# Patient Record
Sex: Female | Born: 1990
Health system: Southern US, Community
[De-identification: ages and names within clinical notes are randomized; demographics above are authoritative.]

## PROBLEM LIST (undated history)

## (undated) DIAGNOSIS — G8929 Other chronic pain: Secondary | ICD-10-CM

## (undated) DIAGNOSIS — M549 Dorsalgia, unspecified: Secondary | ICD-10-CM

## (undated) DIAGNOSIS — M797 Fibromyalgia: Secondary | ICD-10-CM

## (undated) DIAGNOSIS — D352 Benign neoplasm of pituitary gland: Secondary | ICD-10-CM

## (undated) DIAGNOSIS — F4310 Post-traumatic stress disorder, unspecified: Secondary | ICD-10-CM

## (undated) DIAGNOSIS — S069X9A Unspecified intracranial injury with loss of consciousness of unspecified duration, initial encounter: Secondary | ICD-10-CM

## (undated) DIAGNOSIS — R569 Unspecified convulsions: Secondary | ICD-10-CM

## (undated) DIAGNOSIS — S069XAA Unspecified intracranial injury with loss of consciousness status unknown, initial encounter: Secondary | ICD-10-CM

## (undated) DIAGNOSIS — J45909 Unspecified asthma, uncomplicated: Secondary | ICD-10-CM

## (undated) DIAGNOSIS — G43909 Migraine, unspecified, not intractable, without status migrainosus: Secondary | ICD-10-CM

## (undated) HISTORY — DX: Fibromyalgia: M79.7

## (undated) HISTORY — PX: APPENDECTOMY: SHX54

## (undated) HISTORY — PX: KIDNEY STONE SURGERY: SHX686

## (undated) HISTORY — DX: Migraine, unspecified, not intractable, without status migrainosus: G43.909

---

## 2011-07-24 DIAGNOSIS — R569 Unspecified convulsions: Secondary | ICD-10-CM | POA: Insufficient documentation

## 2012-11-10 DIAGNOSIS — F4310 Post-traumatic stress disorder, unspecified: Secondary | ICD-10-CM | POA: Insufficient documentation

## 2012-11-10 DIAGNOSIS — N200 Calculus of kidney: Secondary | ICD-10-CM | POA: Insufficient documentation

## 2012-11-12 DIAGNOSIS — G43109 Migraine with aura, not intractable, without status migrainosus: Secondary | ICD-10-CM | POA: Insufficient documentation

## 2012-11-12 DIAGNOSIS — M5030 Other cervical disc degeneration, unspecified cervical region: Secondary | ICD-10-CM | POA: Insufficient documentation

## 2012-11-12 DIAGNOSIS — R000 Tachycardia, unspecified: Secondary | ICD-10-CM | POA: Insufficient documentation

## 2012-11-12 DIAGNOSIS — J4530 Mild persistent asthma, uncomplicated: Secondary | ICD-10-CM | POA: Insufficient documentation

## 2012-11-12 DIAGNOSIS — D497 Neoplasm of unspecified behavior of endocrine glands and other parts of nervous system: Secondary | ICD-10-CM | POA: Insufficient documentation

## 2012-11-12 DIAGNOSIS — G8929 Other chronic pain: Secondary | ICD-10-CM | POA: Insufficient documentation

## 2012-11-12 DIAGNOSIS — G40909 Epilepsy, unspecified, not intractable, without status epilepticus: Secondary | ICD-10-CM | POA: Insufficient documentation

## 2012-11-12 DIAGNOSIS — M5126 Other intervertebral disc displacement, lumbar region: Secondary | ICD-10-CM | POA: Insufficient documentation

## 2012-11-12 DIAGNOSIS — E669 Obesity, unspecified: Secondary | ICD-10-CM | POA: Insufficient documentation

## 2016-03-28 ENCOUNTER — Encounter (HOSPITAL_BASED_OUTPATIENT_CLINIC_OR_DEPARTMENT_OTHER): Payer: Self-pay | Admitting: Emergency Medicine

## 2016-03-28 DIAGNOSIS — Y939 Activity, unspecified: Secondary | ICD-10-CM | POA: Insufficient documentation

## 2016-03-28 DIAGNOSIS — M542 Cervicalgia: Secondary | ICD-10-CM | POA: Insufficient documentation

## 2016-03-28 DIAGNOSIS — J45909 Unspecified asthma, uncomplicated: Secondary | ICD-10-CM | POA: Insufficient documentation

## 2016-03-28 DIAGNOSIS — M545 Low back pain: Secondary | ICD-10-CM | POA: Diagnosis not present

## 2016-03-28 DIAGNOSIS — Z79899 Other long term (current) drug therapy: Secondary | ICD-10-CM | POA: Insufficient documentation

## 2016-03-28 DIAGNOSIS — S0990XA Unspecified injury of head, initial encounter: Secondary | ICD-10-CM | POA: Diagnosis not present

## 2016-03-28 DIAGNOSIS — Y929 Unspecified place or not applicable: Secondary | ICD-10-CM | POA: Insufficient documentation

## 2016-03-28 DIAGNOSIS — Y999 Unspecified external cause status: Secondary | ICD-10-CM | POA: Insufficient documentation
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
## 2016-03-29 ENCOUNTER — Emergency Department (HOSPITAL_BASED_OUTPATIENT_CLINIC_OR_DEPARTMENT_OTHER): Payer: BLUE CROSS/BLUE SHIELD

## 2016-03-29 ENCOUNTER — Encounter (HOSPITAL_BASED_OUTPATIENT_CLINIC_OR_DEPARTMENT_OTHER): Payer: Self-pay | Admitting: Emergency Medicine

## 2016-03-29 ENCOUNTER — Emergency Department (HOSPITAL_BASED_OUTPATIENT_CLINIC_OR_DEPARTMENT_OTHER)
Admission: EM | Admit: 2016-03-29 | Discharge: 2016-03-29 | Disposition: A | Discharge status: Home / Self Care | Payer: BLUE CROSS/BLUE SHIELD | Attending: Emergency Medicine | Admitting: Emergency Medicine

## 2016-03-29 HISTORY — DX: Unspecified asthma, uncomplicated: J45.909

## 2016-03-29 HISTORY — DX: Dorsalgia, unspecified: M54.9

## 2016-03-29 HISTORY — DX: Unspecified convulsions: R56.9

## 2016-03-29 HISTORY — DX: Other chronic pain: G89.29

## 2016-03-29 HISTORY — DX: Unspecified intracranial injury with loss of consciousness of unspecified duration, initial encounter: S06.9X9A

## 2016-03-29 HISTORY — DX: Unspecified intracranial injury with loss of consciousness status unknown, initial encounter: S06.9XAA

## 2016-03-29 HISTORY — DX: Benign neoplasm of pituitary gland: D35.2

## 2016-03-29 LAB — PREGNANCY, URINE: PREG TEST UR: NEGATIVE

## 2016-03-29 MED ORDER — METHOCARBAMOL 500 MG PO TABS: 500.0000 mg | ORAL_TABLET | Freq: Two times a day (BID) | ORAL | 0 refills | Status: DC

## 2016-03-29 MED ORDER — LIDOCAINE 5 % EX PTCH: 1.0000 | MEDICATED_PATCH | CUTANEOUS | 0 refills | Status: DC

## 2016-03-29 MED ORDER — OXYCODONE-ACETAMINOPHEN 5-325 MG PO TABS
1.0000 | ORAL_TABLET | Freq: Once | ORAL | Status: AC
  Administered 2016-03-29: 1 via ORAL
  Filled 2016-03-29: qty 1

## 2016-03-29 MED ORDER — METHOCARBAMOL 500 MG PO TABS
1000.0000 mg | ORAL_TABLET | Freq: Once | ORAL | Status: AC
  Administered 2016-03-29: 1000 mg via ORAL
  Filled 2016-03-29: qty 2

## 2016-03-29 MED ORDER — KETOROLAC TROMETHAMINE 60 MG/2ML IM SOLN
60.0000 mg | Freq: Once | INTRAMUSCULAR | Status: AC
  Administered 2016-03-29: 60 mg via INTRAMUSCULAR
  Filled 2016-03-29: qty 2

## 2016-03-29 NOTE — ED Provider Notes (Signed)
Observed in ED for any signs of allergic reaction following toradol.  There were no signs.  Patient is stable for discharge.     Veatrice Kells, MD 03/29/16 [phone]

## 2016-03-29 NOTE — ED Notes (Signed)
Patient transported to CT 
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
## 2016-05-28 ENCOUNTER — Other Ambulatory Visit (HOSPITAL_COMMUNITY): Payer: Self-pay | Admitting: Family Medicine

## 2016-05-28 ENCOUNTER — Other Ambulatory Visit: Payer: Self-pay | Admitting: Family Medicine

## 2016-05-28 DIAGNOSIS — M5414 Radiculopathy, thoracic region: Secondary | ICD-10-CM

## 2016-05-31 ENCOUNTER — Telehealth: Payer: Self-pay | Admitting: Behavioral Health

## 2016-05-31 NOTE — Telephone Encounter (Signed)
Unable to reach patient at time of Pre-Visit Call.  Left message for patient to return call when available.    

## 2016-06-03 ENCOUNTER — Ambulatory Visit: Payer: BLUE CROSS/BLUE SHIELD | Admitting: Family Medicine

## 2016-08-21 ENCOUNTER — Emergency Department (HOSPITAL_COMMUNITY)
Admission: EM | Admit: 2016-08-21 | Discharge: 2016-08-21 | Disposition: A | Discharge status: Home / Self Care | Payer: BLUE CROSS/BLUE SHIELD

## 2016-08-22 ENCOUNTER — Ambulatory Visit (INDEPENDENT_AMBULATORY_CARE_PROVIDER_SITE_OTHER): Payer: Self-pay | Admitting: Physician Assistant

## 2016-08-22 VITALS — BP 146/111 | HR 99 | Temp 97.6°F | Ht 62.0 in | Wt 206.6 lb

## 2016-08-22 DIAGNOSIS — Z23 Encounter for immunization: Secondary | ICD-10-CM

## 2016-08-22 DIAGNOSIS — R030 Elevated blood-pressure reading, without diagnosis of hypertension: Secondary | ICD-10-CM

## 2016-08-22 DIAGNOSIS — I10 Essential (primary) hypertension: Secondary | ICD-10-CM

## 2016-08-22 DIAGNOSIS — R42 Dizziness and giddiness: Secondary | ICD-10-CM

## 2016-08-22 MED ORDER — LISINOPRIL 5 MG PO TABS: 5.0000 mg | ORAL_TABLET | Freq: Every day | ORAL | 1 refills | Status: DC

## 2016-08-22 NOTE — Patient Instructions (Addendum)
Your EKG looks great!  Come back in 3-4 weeks for blood pressure recheck.  Please schedule an annual appointment with me in the next few months.  You cannot take this medication while you are pregnant. Please let me know ASAP if you find you are pregnant.   Thank you for coming in today. I hope you feel we met your needs.  Feel free to call [facility] if you have any questions or further requests.  Please consider signing up for MyChart if you do not already have it, as this is a great way to communicate with me.  Best,  Whitney McVey, PA-C   DASH Eating Plan DASH stands for "Dietary Approaches to Stop Hypertension." The DASH eating plan is a healthy eating plan that has been shown to reduce high blood pressure (hypertension). Additional health benefits may include reducing the risk of type 2 diabetes mellitus, heart disease, and stroke. The DASH eating plan may also help with weight loss. What do I need to know about the DASH eating plan? For the DASH eating plan, you will follow these general guidelines:  Choose foods with less than 150 milligrams of sodium per serving (as listed on the food label).  Use salt-free seasonings or herbs instead of table salt or sea salt.  Check with your health care provider or pharmacist before using salt substitutes.  Eat lower-sodium products. These are often labeled as "low-sodium" or "no salt added."  Eat fresh foods. Avoid eating a lot of canned foods.  Eat more vegetables, fruits, and low-fat dairy products.  Choose whole grains. Look for the word "whole" as the first word in the ingredient list.  Choose fish and skinless chicken or [place] more often than red meat. Limit fish, poultry, and meat to 6 oz (170 g) each day.  Limit sweets, desserts, sugars, and sugary drinks.  Choose heart-healthy fats.  Eat more home-cooked food and less restaurant, buffet, and fast food.  Limit fried foods.  Do not fry foods. Cook foods using methods such as  baking, boiling, grilling, and broiling instead.  When eating at a restaurant, ask that your food be prepared with less salt, or no salt if possible. What foods can I eat? Seek help from a dietitian for individual calorie needs. Grains  Whole grain or whole wheat bread. Brown rice. Whole grain or whole wheat pasta. Quinoa, bulgur, and whole grain cereals. Low-sodium cereals. Corn or whole wheat flour tortillas. Whole grain cornbread. Whole grain crackers. Low-sodium crackers. Vegetables  Fresh or frozen vegetables (raw, steamed, roasted, or grilled). Low-sodium or reduced-sodium tomato and vegetable juices. Low-sodium or reduced-sodium tomato sauce and paste. Low-sodium or reduced-sodium canned vegetables. Fruits  All fresh, canned (in natural juice), or frozen fruits. Meat and Other Protein Products  Ground beef (85% or leaner), grass-fed beef, or beef trimmed of fat. Skinless chicken or [place]. Ground chicken or [place]. Pork trimmed of fat. All fish and seafood. Eggs. Dried beans, peas, or lentils. Unsalted nuts and seeds. Unsalted canned beans. Dairy  Low-fat dairy products, such as skim or 1% milk, 2% or reduced-fat cheeses, low-fat ricotta or cottage cheese, or plain low-fat yogurt. Low-sodium or reduced-sodium cheeses. Fats and Oils  Tub margarines without trans fats. Light or reduced-fat mayonnaise and salad dressings (reduced sodium). Avocado. Safflower, olive, or canola oils. Natural peanut or almond butter. Other  Unsalted popcorn and pretzels. The items listed above may not be a complete list of recommended foods or beverages. Contact your dietitian for more options.  What foods are not recommended? Grains  White bread. White pasta. White rice. Refined cornbread. Bagels and croissants. Crackers that contain trans fat. Vegetables  Creamed or fried vegetables. Vegetables in a cheese sauce. Regular canned vegetables. Regular canned tomato sauce and paste. Regular tomato and vegetable  juices. Fruits  Canned fruit in light or heavy syrup. Fruit juice. Meat and Other Protein Products  Fatty cuts of meat. Ribs, chicken wings, bacon, sausage, bologna, salami, chitterlings, fatback, hot dogs, bratwurst, and packaged luncheon meats. Salted nuts and seeds. Canned beans with salt. Dairy  Whole or 2% milk, cream, half-and-half, and cream cheese. Whole-fat or sweetened yogurt. Full-fat cheeses or blue cheese. Nondairy creamers and whipped toppings. Processed cheese, cheese spreads, or cheese curds. Condiments  Onion and garlic salt, seasoned salt, table salt, and sea salt. Canned and packaged gravies. Worcestershire sauce. Tartar sauce. Barbecue sauce. Teriyaki sauce. Soy sauce, including reduced sodium. Steak sauce. Fish sauce. Oyster sauce. Cocktail sauce. Horseradish. Ketchup and mustard. Meat flavorings and tenderizers. Bouillon cubes. Hot sauce. Tabasco sauce. Marinades. Taco seasonings. Relishes. Fats and Oils  Butter, stick margarine, lard, shortening, ghee, and bacon fat. Coconut, palm kernel, or palm oils. Regular salad dressings. Other  Pickles and olives. Salted popcorn and pretzels. The items listed above may not be a complete list of foods and beverages to avoid. Contact your dietitian for more information.  Where can I find more information? National Heart, Lung, and Blood Institute: travelstabloid.com This information is not intended to replace advice given to you by your health care provider. Make sure you discuss any questions you have with your health care provider. Document Released: 06/20/2011 Document Revised: 12/07/2015 Document Reviewed: 05/05/2013 Elsevier Interactive Patient Education  2017 [employer].    IF you received an x-ray today, you will receive an invoice from [facility] Radiology. Please contact [facility] Radiology at [phone] with questions or concerns regarding your invoice.   IF you received labwork  today, you will receive an invoice from [place]. Please contact [facility] at [phone] with questions or concerns regarding your invoice.   Our billing staff will not be able to assist you with questions regarding bills from these companies.  You will be contacted with the lab results as soon as they are available. The fastest way to get your results is to activate your My Chart account. Instructions are located on the last page of this paperwork. If you have not heard from [place] regarding the results in 2 weeks, please contact this office.

## 2016-08-22 NOTE — Progress Notes (Signed)
Kristine Kelley  MRN: [id number] DOB: 06/20/1991  PCP: No PCP Per Patient  Subjective:  Pt is a 26 year old female PMH asthma, epilepsy, pituitary tumor, PTSD, migraines, TBI's, neuropathy who presents to clinic for elevated blood pressure reading.  Her only care provider is her pain management provider, who told her her blood pressure readings "have been marching up" over the last year.  Today's pressure 170/110, recheck with large cuff is 146/111.   +Dizziness and SOB a few times a week.  Started a few months ago. Not related to exercise. More often at night. Not stress/anxiety related. Denies chest pain, palpitations, syncope.   Diet - Chicken, vegetables, fast food 1-2x/week, does not drink soda. Drinks mostly water and herbal tea.  Exercise - Walks her dog every day. Once a week she does agility work with her dog. Service dog for seizures.   Fhx: Mom- pace-maker, HTN, heart disease. Father - Heart disease. Father h/o alcohol abuse. Mother h/o drug abuse since age 69.   Review of Systems  Constitutional: Negative for chills, diaphoresis and fatigue.  Respiratory: Positive for shortness of breath. Negative for cough, chest tightness and wheezing.   Cardiovascular: Negative for chest pain and palpitations.  Gastrointestinal: Negative for abdominal pain, diarrhea, nausea and vomiting.  Neurological: Positive for dizziness. Negative for weakness, light-headedness and headaches.    There are no active problems to display for this patient.   Current Outpatient Prescriptions on File Prior to Visit  Medication Sig Dispense Refill  . Diclofenac Potassium (CAMBIA PO) Take by mouth.    . DULoxetine (CYMBALTA) 60 MG capsule Take 60 mg by mouth daily.    . fentaNYL (DURAGESIC - DOSED MCG/HR) 25 MCG/HR patch Place 25 mcg onto the skin every 3 (three) days.    Marland Kitchen lamoTRIgine (LAMICTAL) 150 MG tablet Take 150 mg by mouth daily.    Marland Kitchen lidocaine (LIDODERM) 5 % Place 1 patch onto the skin daily.  Remove & Discard patch within 12 hours or as directed by MD 14 patch 0  . methocarbamol (ROBAXIN) 500 MG tablet Take 1 tablet (500 mg total) by mouth 2 (two) times daily. 20 tablet 0  . ondansetron (ZOFRAN) 4 MG tablet Take 4 mg by mouth every 8 (eight) hours as needed for nausea or vomiting.    Marland Kitchen oxyCODONE-acetaminophen (PERCOCET/ROXICET) 5-325 MG tablet Take by mouth every 4 (four) hours as needed for severe pain.    . promethazine (PHENERGAN) 25 MG tablet Take by mouth every 6 (six) hours as needed for nausea or vomiting.    Marland Kitchen tiZANidine (ZANAFLEX) 4 MG tablet Take 4 mg by mouth every 6 (six) hours as needed for muscle spasms.    Marland Kitchen topiramate (TOPAMAX) 25 MG capsule Take 25 mg by mouth 2 (two) times daily.     No current facility-administered medications on file prior to visit.     Allergies  Allergen Reactions  . Ambien [Zolpidem Tartrate] Other (See Comments)    PTSD, nightmares   . Dilantin [Phenytoin]     Intranasally - it burns   . Naproxen Hives     Objective:  BP (!) 170/110   Pulse (!) 110   Temp 97.6 F (36.4 C) (Oral)   Ht 5\' 2"  (1.575 m)   Wt 206 lb 9.6 oz (93.7 kg)   LMP 07/29/2016 (Approximate)   SpO2 96%   BMI 37.79 kg/m   Physical Exam  Constitutional: She is oriented to person, place, and time and well-developed, well-nourished, and  in no distress. No distress.  Cardiovascular: Normal rate, regular rhythm and normal heart sounds.   Pulmonary/Chest: Effort normal. No respiratory distress.  Neurological: She is alert and oriented to person, place, and time. GCS score is 15.  Skin: Skin is warm and dry.  Psychiatric: Mood, memory, affect and judgment normal.  Vitals reviewed.  EKG shows NSR. Assessment and Plan :  1. Essential hypertension - lisinopril (PRINIVIL,ZESTRIL) 5 MG tablet; Take 1 tablet (5 mg total) by mouth daily.  Dispense: 30 tablet; Refill: 1 - Lifestyle modifications discussed and encouraged. Discussed stress-relieving techniques.  Will  start on medication today. RTC in 3-4 weeks for blood pressure check.   2. Elevated blood pressure reading 3. Dizziness - Recheck vitals - EKG 12-Lead - EKG is reassuring. No concern at this time for cardiac etiology of dizziness and SOB. If symptoms continue, consider cardiology referral.   4. Need for prophylactic vaccination and inoculation against influenza - Flu Vaccine QUAD 36+ mos IM   Mercer Pod, PA-C  [facility] [place] 08/22/2016 9:47 AM

## 2016-08-23 ENCOUNTER — Telehealth: Payer: Self-pay

## 2016-08-23 NOTE — Telephone Encounter (Signed)
Thank you :)

## 2016-08-23 NOTE — Telephone Encounter (Signed)
Left message at patient's number that it is very unlikely that these symptoms are due to the lisinopril. Recommended that she try again and see if the symptoms recur, and if so, try taking 1/2 tablet. Can also try stopping it and see if the symptoms resolve, and then retry it.  If the symptoms resolve when she stops it AND recur when she resumes it, she should let us know, and we'll change the medication. However, much more likely that something else is causing the sweating and nightmares.
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
## 2016-09-16 ENCOUNTER — Ambulatory Visit: Payer: Self-pay | Admitting: Physician Assistant

## 2016-09-18 ENCOUNTER — Other Ambulatory Visit: Payer: Self-pay | Admitting: Physician Assistant

## 2016-09-18 DIAGNOSIS — I10 Essential (primary) hypertension: Secondary | ICD-10-CM

## 2016-09-18 NOTE — Telephone Encounter (Signed)
Initial Rx was on 2/08 for #30, RF x 1. 1. Patient shouldn't be out 2. Patient needs visit

## 2016-10-02 ENCOUNTER — Ambulatory Visit (INDEPENDENT_AMBULATORY_CARE_PROVIDER_SITE_OTHER): Payer: BLUE CROSS/BLUE SHIELD | Admitting: Physician Assistant

## 2016-10-02 VITALS — BP 130/80 | HR 102 | Temp 98.2°F | Resp 17 | Ht 62.5 in | Wt 206.0 lb

## 2016-10-02 DIAGNOSIS — E669 Obesity, unspecified: Secondary | ICD-10-CM | POA: Diagnosis not present

## 2016-10-02 DIAGNOSIS — Z6837 Body mass index (BMI) 37.0-37.9, adult: Secondary | ICD-10-CM | POA: Diagnosis not present

## 2016-10-02 DIAGNOSIS — I10 Essential (primary) hypertension: Secondary | ICD-10-CM

## 2016-10-02 DIAGNOSIS — E66812 Obesity, class 2: Secondary | ICD-10-CM

## 2016-10-02 LAB — POCT URINALYSIS DIP (MANUAL ENTRY)
Blood, UA: NEGATIVE
Glucose, UA: NEGATIVE
Nitrite, UA: NEGATIVE
Protein Ur, POC: 30 — AB
Spec Grav, UA: 1.03 (ref 1.030–1.035)
Urobilinogen, UA: 0.2 (ref ?–2.0)
pH, UA: 6 (ref 5.0–8.0)

## 2016-10-02 LAB — POC MICROSCOPIC URINALYSIS (UMFC): Mucus: ABSENT

## 2016-10-02 MED ORDER — LISINOPRIL 5 MG PO TABS: 5.0000 mg | ORAL_TABLET | Freq: Every day | ORAL | 3 refills | Status: DC

## 2016-10-02 NOTE — Patient Instructions (Addendum)
Come back and see me in 3 months. Keep up the hard work on your Cough to 5K!!   Thank you for coming in today. I hope you feel we met your needs.  Feel free to call [facility] if you have any questions or further requests.  Please consider signing up for MyChart if you do not already have it, as this is a great way to communicate with me.  Best,  [employer], PA-C   Exercising to [employer] Exercising can help you to lose weight. In order to lose weight through exercise, you need to do vigorous-intensity exercise. You can tell that you are exercising with vigorous intensity if you are breathing very hard and fast and cannot hold a conversation while exercising. Moderate-intensity exercise helps to maintain your current weight. You can tell that you are exercising at a moderate level if you have a higher heart rate and faster breathing, but you are still able to hold a conversation. How often should I exercise? Choose an activity that you enjoy and set realistic goals. Your health care provider can help you to make an activity plan that works for you. Exercise regularly as directed by your health care provider. This may include:  Doing resistance training twice each week, such as:  Push-ups.  Sit-ups.  Lifting weights.  Using resistance bands.  Doing a given intensity of exercise for a given amount of time. Choose from these options:  150 minutes of moderate-intensity exercise every week.  75 minutes of vigorous-intensity exercise every week.  A mix of moderate-intensity and vigorous-intensity exercise every week. Children, pregnant women, people who are out of shape, people who are overweight, and older adults may need to consult a health care provider for individual recommendations. If you have any sort of medical condition, be sure to consult your health care provider before starting a new exercise program. What are some activities that can help me to lose weight?  Walking at a  rate of at least 4.5 miles an hour.  Jogging or running at a rate of 5 miles per hour.  Biking at a rate of at least 10 miles per hour.  Lap swimming.  Roller-skating or in-line skating.  Cross-country skiing.  Vigorous competitive sports, such as football, basketball, and soccer.  Jumping rope.  Aerobic dancing. How can I be more active in my day-to-day activities?  Use the stairs instead of the elevator.  Take a walk during your lunch break.  If you drive, park your car farther away from work or school.  If you take public transportation, get off one stop early and walk the rest of the way.  Make all of your phone calls while standing up and walking around.  Get up, stretch, and walk around every 30 minutes throughout the day. What guidelines should I follow while exercising?  Do not exercise so much that you hurt yourself, feel dizzy, or get very short of breath.  Consult your health care provider prior to starting a new exercise program.  Wear comfortable clothes and shoes with good support.  Drink plenty of water while you exercise to prevent dehydration or heat stroke. Body water is lost during exercise and must be replaced.  Work out until you breathe faster and your heart beats faster. This information is not intended to replace advice given to you by your health care provider. Make sure you discuss any questions you have with your health care provider. Document Released: 08/03/2010 Document Revised: 12/07/2015 Document Reviewed:  12/02/2013 Elsevier Interactive Patient Education  2017 [employer].  IF you received an x-ray today, you will receive an invoice from [facility] Rad[facility]. Please contact [facility] Radiology at [phone] with questions or concerns regarding your invoice.   IF you received labwork today, you will receive an invoice from [place]. Please contact [facility] at [phone] with questions or concerns regarding your invoice.   Our  billing staff will not be able to assist you with questions regarding bills from these companies.  You will be contacted with the lab results as soon as they are available. The fastest way to get your results is to activate your My Chart account. Instructions are located on the last page of this paperwork. If you have not heard from [place] regarding the results in 2 weeks, please contact this office.

## 2016-10-02 NOTE — Progress Notes (Signed)
Kristine Kelley  MRN: [id number] DOB: 1991-01-10  PCP: Gelene Mink Theseus Birnie, PA-C  Subjective:  Pt is a pleasant 26 year old female PMH asthma, epilepsy, pituitary tumor, PTSD, migraines, TBI's, neuropathy who presents to clinic for blood pressure f/u.    Last OV 08/22/2016. Dx with high blood pressure - at that OV pressures were 170/110, 146/111. She complained of dizziness and SOB a few times a week.  EKG showed NSR.  She was started on Lisinopril 78m  Today's blood pressure is 130/80.  She is getting a blood pressure cuff to keep at home.   Diet - Chicken, vegetables, fast food 1-2x/week, does not drink soda. Drinks mostly water and herbal tea.  Exercise - She has started to run 3x/week. "Cough to 5K".  Walks her dog every day. Once a week she does agility work with her dog. Service dog for seizures.   Fhx: Mom- pace-maker, HTN, heart disease. Father - Heart disease. Father h/o alcohol abuse. Mother h/o drug abuse since age 28   Review of Systems  Constitutional: Negative for chills, diaphoresis, fatigue and fever.  Respiratory: Negative for cough, chest tightness, shortness of breath and wheezing.   Cardiovascular: Negative for chest pain and palpitations.  Gastrointestinal: Negative for abdominal pain, diarrhea, nausea and vomiting.  Neurological: Negative for weakness, light-headedness and headaches.    There are no active problems to display for this patient.   Current Outpatient Prescriptions on File Prior to Visit  Medication Sig Dispense Refill  . Diclofenac Potassium (CAMBIA PO) Take by mouth.    . DULoxetine (CYMBALTA) 60 MG capsule Take 60 mg by mouth daily.    . fentaNYL (DURAGESIC - DOSED MCG/HR) 25 MCG/HR patch Place 25 mcg onto the skin every 3 (three) days.    .Marland KitchenlamoTRIgine (LAMICTAL) 150 MG tablet Take 150 mg by mouth daily.    .Marland Kitchenlidocaine (LIDODERM) 5 % Place 1 patch onto the skin daily. Remove & Discard patch within 12 hours or as directed by MD 14 patch  0  . lisinopril (PRINIVIL,ZESTRIL) 5 MG tablet Take 1 tablet (5 mg total) by mouth daily. 30 tablet 1  . methocarbamol (ROBAXIN) 500 MG tablet Take 1 tablet (500 mg total) by mouth 2 (two) times daily. 20 tablet 0  . ondansetron (ZOFRAN) 4 MG tablet Take 4 mg by mouth every 8 (eight) hours as needed for nausea or vomiting.    .Marland KitchenoxyCODONE-acetaminophen (PERCOCET/ROXICET) 5-325 MG tablet Take by mouth every 4 (four) hours as needed for severe pain.    . promethazine (PHENERGAN) 25 MG tablet Take by mouth every 6 (six) hours as needed for nausea or vomiting.    .Marland KitchentiZANidine (ZANAFLEX) 4 MG tablet Take 4 mg by mouth every 6 (six) hours as needed for muscle spasms.    .Marland Kitchentopiramate (TOPAMAX) 25 MG capsule Take 25 mg by mouth 2 (two) times daily.     No current facility-administered medications on file prior to visit.     Allergies  Allergen Reactions  . Ambien [Zolpidem Tartrate] Other (See Comments)    PTSD, nightmares   . Dilantin [Phenytoin]     Intranasally - it burns   . Naproxen Hives     Objective:  BP 130/80   Pulse (!) 102   Temp 98.2 F (36.8 C) (Oral)   Resp 17   Ht 5' 2.5" (1.588 m)   Wt 206 lb (93.4 kg)   LMP 09/25/2016 (Approximate)   SpO2 97%   BMI 37.08 kg/m  Physical Exam  Constitutional: She is oriented to person, place, and time and well-developed, well-nourished, and in no distress. Vital signs are normal. No distress.  obese  Cardiovascular: Normal rate, regular rhythm and normal heart sounds.   Neurological: She is alert and oriented to person, place, and time. GCS score is 15.  Skin: Skin is warm and dry.  Psychiatric: Mood, memory, affect and judgment normal.  Vitals reviewed.   Assessment and Plan :  1. Essential hypertension 2. Class 2 obesity with body mass index (BMI) of 37.0 to 37.9 in adult, unspecified obesity type, unspecified whether serious comorbidity present - CMP14+EGFR - CBC with Differential/Platelet - Lipid panel - TSH - POCT  urinalysis dipstick - POCT Microscopic Urinalysis ([facility]) - lisinopril (PRINIVIL,ZESTRIL) 5 MG tablet; Take 1 tablet (5 mg total) by mouth daily.  Dispense: 30 tablet; Refill: 3 - Basic lab work today, labs are pending. Will contact with results. F/u in 3 months for blood pressure check. There are several care gaps for this pt. Encouraged her to schedule an annual exam. Encouraged her to continue eating healthy and "couch to 5K" running regimen.   Mercer Pod, PA-C  [facility] 10/02/2016 3:16 PM

## 2016-10-03 ENCOUNTER — Encounter: Payer: Self-pay | Admitting: Physician Assistant

## 2016-10-03 LAB — CMP14+EGFR
ALT: 59 IU/L — ABNORMAL HIGH (ref 0–32)
AST: 41 IU/L — ABNORMAL HIGH (ref 0–40)
Albumin/Globulin Ratio: 2 (ref 1.2–2.2)
Albumin: 4.7 g/dL (ref 3.5–5.5)
Alkaline Phosphatase: 82 IU/L (ref 39–117)
BUN/Creatinine Ratio: 23 (ref 9–23)
BUN: 17 mg/dL (ref 6–20)
Bilirubin Total: 0.3 mg/dL (ref 0.0–1.2)
CO2: 21 mmol/L (ref 18–29)
Calcium: 9.6 mg/dL (ref 8.7–10.2)
Chloride: 101 mmol/L (ref 96–106)
Creatinine, Ser: 0.73 mg/dL (ref 0.57–1.00)
GFR calc Af Amer: 131 mL/min/{1.73_m2} (ref >59)
GFR calc non Af Amer: 113 mL/min/{1.73_m2} (ref >59)
Globulin, Total: 2.3 g/dL (ref 1.5–4.5)
Glucose: 80 mg/dL (ref 65–99)
Potassium: 4.2 mmol/L (ref 3.5–5.2)
Sodium: 137 mmol/L (ref 134–144)
Total Protein: 7 g/dL (ref 6.0–8.5)

## 2016-10-03 LAB — LIPID PANEL
Chol/HDL Ratio: 5 ratio units — ABNORMAL HIGH (ref 0.0–4.4)
Cholesterol, Total: 210 mg/dL — ABNORMAL HIGH (ref 100–199)
HDL: 42 mg/dL (ref >39)
LDL Calculated: 130 mg/dL — ABNORMAL HIGH (ref 0–99)
Triglycerides: 192 mg/dL — ABNORMAL HIGH (ref 0–149)
VLDL Cholesterol Cal: 38 mg/dL (ref 5–40)

## 2016-10-03 LAB — CBC WITH DIFFERENTIAL/PLATELET
Basophils Absolute: 0.1 10*3/uL (ref 0.0–0.2)
Basos: 1 %
EOS (ABSOLUTE): 0.4 10*3/uL (ref 0.0–0.4)
Eos: 6 %
Hematocrit: 40.4 % (ref 34.0–46.6)
Hemoglobin: 13.7 g/dL (ref 11.1–15.9)
Immature Grans (Abs): 0 10*3/uL (ref 0.0–0.1)
Immature Granulocytes: 0 %
Lymphocytes Absolute: 2.9 10*3/uL (ref 0.7–3.1)
Lymphs: 45 %
MCH: 29.5 pg (ref 26.6–33.0)
MCHC: 33.9 g/dL (ref 31.5–35.7)
MCV: 87 fL (ref 79–97)
Monocytes Absolute: 0.3 10*3/uL (ref 0.1–0.9)
Monocytes: 5 %
Neutrophils Absolute: 2.8 10*3/uL (ref 1.4–7.0)
Neutrophils: 43 %
Platelets: 251 10*3/uL (ref 150–379)
RBC: 4.64 x10E6/uL (ref 3.77–5.28)
RDW: 13 % (ref 12.3–15.4)
WBC: 6.4 10*3/uL (ref 3.4–10.8)

## 2016-10-03 LAB — TSH: TSH: 2.05 u[IU]/mL (ref 0.450–4.500)

## 2016-10-03 NOTE — Progress Notes (Signed)
Please call pt and let her know her cholesterol level is elevated. Please report that patient's total cholesterol, bad cholesterol (LDL) and fatty acids were high. I would recommend that the patient eat healthier, cut down on fatty foods, fried foods, limit red meat to once a week, eat balanced meals including a serving of vegetables and/or fruits with every meal. Continue "couch to 5K" training.  F/u in 3 months for blood pressure check.  Thank you!

## 2016-10-07 ENCOUNTER — Telehealth: Payer: Self-pay | Admitting: Physician Assistant

## 2016-10-07 NOTE — Telephone Encounter (Signed)
b/p 177/127 does have dizziness, heart palp and headache. I advised er now and bring monitor with to compare hisband verbalizes understanding

## 2016-10-07 NOTE — Telephone Encounter (Signed)
lmtcb with symptoms and re check #

## 2016-10-07 NOTE — Telephone Encounter (Signed)
Pt's husband called saying lately that her BP readings are running higher even though she is taking her medication.  This morning it was 168/109 at 7:30 am.  I did advise him that they may require her to come in, however last OV for this was 10-02-16. [phone]

## 2016-10-08 NOTE — Telephone Encounter (Signed)
Noted! Thank you

## 2016-10-12 ENCOUNTER — Telehealth: Payer: Self-pay | Admitting: Family Medicine

## 2016-10-12 NOTE — Telephone Encounter (Signed)
Answering service call by female relative with Kristine Kelley. Reports h/o high BP and fibromyalgia/chronic pain on fentanyl and oxycodone prn breakthrough pain. Has been also dealing with hypertension and sev wks ago her BP meds were increased. For the past 2d, her BP was been very low and tonight it has been 91/58 and now 101/70 with pulse 85.  BP cuff checked on himself and his bp was nml. She has been very dizzy this evening and is confused and very tired. She keeps falling off to sleep and he has to wake her. Not eating/drinking much.. Female states he asked if her is took anything or orverdid her pain meds and she denied.  Advised he needs to call 911 immed - not safe to drive her to ER by himself. Advised she needs stat cbg, IVF, EKG, and poss narcan.  Even is this is just hypotension due to BP meds, might need medical support until they are out of her system.  Female understands and agrees to call 911 immed.

## 2016-10-14 ENCOUNTER — Encounter: Payer: Self-pay | Admitting: Physician Assistant

## 2016-10-15 NOTE — Telephone Encounter (Signed)
Please call and check in on pt

## 2016-10-15 NOTE — Telephone Encounter (Signed)
n/a lm to f/u with [place]

## 2016-10-16 NOTE — Telephone Encounter (Signed)
Left message emphasizing information from Dr. Brigitte Pulse.

## 2016-10-24 ENCOUNTER — Other Ambulatory Visit (HOSPITAL_COMMUNITY): Payer: Self-pay | Admitting: Family Medicine

## 2016-10-24 DIAGNOSIS — M5412 Radiculopathy, cervical region: Secondary | ICD-10-CM

## 2016-10-24 DIAGNOSIS — M546 Pain in thoracic spine: Secondary | ICD-10-CM

## 2016-11-11 ENCOUNTER — Encounter: Payer: BLUE CROSS/BLUE SHIELD | Admitting: Physician Assistant

## 2016-11-25 ENCOUNTER — Telehealth: Payer: Self-pay | Admitting: Physician Assistant

## 2016-11-25 DIAGNOSIS — I10 Essential (primary) hypertension: Secondary | ICD-10-CM

## 2016-11-25 MED ORDER — LISINOPRIL 5 MG PO TABS
5.0000 mg | ORAL_TABLET | Freq: Every day | ORAL | 3 refills | Status: DC
Start: 2016-11-25 — End: 2017-01-03

## 2016-11-25 NOTE — Telephone Encounter (Signed)
Pt is needing a refill on her blood pressure medication   Best number is [phone]

## 2016-12-11 ENCOUNTER — Telehealth: Payer: Self-pay | Admitting: Family Medicine

## 2016-12-11 ENCOUNTER — Encounter: Payer: Self-pay | Admitting: Physician Assistant

## 2016-12-11 ENCOUNTER — Ambulatory Visit (INDEPENDENT_AMBULATORY_CARE_PROVIDER_SITE_OTHER): Payer: BLUE CROSS/BLUE SHIELD | Admitting: Physician Assistant

## 2016-12-11 VITALS — BP 138/88 | HR 114 | Temp 98.7°F | Resp 17 | Ht 62.5 in | Wt 206.0 lb

## 2016-12-11 DIAGNOSIS — I10 Essential (primary) hypertension: Secondary | ICD-10-CM | POA: Diagnosis not present

## 2016-12-11 DIAGNOSIS — R000 Tachycardia, unspecified: Secondary | ICD-10-CM

## 2016-12-11 DIAGNOSIS — R030 Elevated blood-pressure reading, without diagnosis of hypertension: Secondary | ICD-10-CM | POA: Diagnosis not present

## 2016-12-11 NOTE — Telephone Encounter (Signed)
Thank you. I see she is scheduled for this afternoon.

## 2016-12-11 NOTE — Patient Instructions (Addendum)
Please bring your pee bucket (aka chamber pot) back at your earliest convenience. Pee in it as directed.   Thank you for coming in today. I hope you feel we met your needs.  Feel free to call [facility] if you have any questions or further requests.  Please consider signing up for MyChart if you do not already have it, as this is a great way to communicate with me.  Best,  Whitney McVey, PA-C  What are shin splints?-"Shin splints" is a term people use to describe a pain they get in their shins when they exercise. The medical term for shin splints is "medial tibial stress syndrome." Even though shin splints hurt a lot, they are not usually anything to worry about.  How are shin splints treated?-Treatment involves: ?Rest - If you are a runner, you should either stop running or reduce the number of miles you run until your symptoms improve or go away. While you wait, you can try another type of exercise that doesn't stress your shins, such as swimming. ?Elevation - Prop the affected leg up on pillows or a chair when you are resting. ?Ice your shin - Put a cold gel pack, bag of ice, or bag of frozen vegetables on your shin every 1 to 2 hours, for 15 minutes each time. Put a thin towel between the ice (or other cold object) and your skin. ?Take a pain-relieving medicine - If you need it, take an over-the-counter pain medicine, such as acetaminophen (sample brand name: Tylenol), ibuprofen (sample brand names: Advil, Motrin), or naproxen (sample brand names: Aleve, Naprosyn). Can shin splints be prevented?-It's possible you can reduce your chances of getting shin splints by wearing shoes with extra cushioning.    IF you received an x-ray today, you will receive an invoice from [facility]. Please contact [facility] at [phone] with questions or concerns regarding your invoice.   IF you received labwork today, you will receive an invoice from [facility]. Please contact [facility] at  [phone] with questions or concerns regarding your invoice.   Our billing staff will not be able to assist you with questions regarding bills from these companies.  You will be contacted with the lab results as soon as they are available. The fastest way to get your results is to activate your My Chart account. Instructions are located on the last page of this paperwork. If you have not heard from [place] regarding the results in 2 weeks, please contact this office.

## 2016-12-11 NOTE — Progress Notes (Signed)
Kristine Kelley  MRN: [id number] DOB: [birth year]  PCP: Dorise Hiss, PA-C  Subjective:  Pt is a 26 year old female PMH epilepsy, pituitary tumor, PTSD, migraines, TBI's, neuropathy, and asthma who presents to clinic for hypertension management.  HTN - uncontrolled with lisinopril 10mg  qd. Today's blood pressure is 158/99, repeat is 138/88.   She was seen at the ED for HTN 09/2016. Increased Lisinopril from 5mg  qd to 10mg  qd.  She reports taking home blood pressures. Readings fluctuate "from high to normal". Endorses medication compliance.    Endorses intermittent episodes of feeling sweaty (especially at night), palpitations, shob, and dizziness. Nothing seems to be a known trigger. Nothing makes it better. These have been occurring since about 2012. There is no association with specific time of day. Normal EKG 08/22/2016.   Of note, she was diagnosed with pituitary tumor around 2011 while living in [place]. Nothing was done about this.    Family history is mostly unknown. She is estranged from her mother and father and prefers to not contact them.   Review of Systems  Constitutional: Positive for diaphoresis. Negative for chills and fever.  Respiratory: Positive for shortness of breath.   Cardiovascular: Positive for palpitations.  Neurological: Positive for dizziness.    Patient Active Problem List   Diagnosis Date Noted  . Essential hypertension 12/11/2016    Current Outpatient Prescriptions on File Prior to Visit  Medication Sig Dispense Refill  . Diclofenac Potassium (CAMBIA PO) Take by mouth.    . DULoxetine (CYMBALTA) 60 MG capsule Take 60 mg by mouth daily.    . fentaNYL (DURAGESIC - DOSED MCG/HR) 25 MCG/HR patch Place 25 mcg onto the skin every 3 (three) days.    Marland Kitchen lamoTRIgine (LAMICTAL) 150 MG tablet Take 150 mg by mouth daily.    Marland Kitchen lidocaine (LIDODERM) 5 % Place 1 patch onto the skin daily. Remove & Discard patch within 12 hours or as directed by MD 14 patch  0  . lisinopril (PRINIVIL,ZESTRIL) 5 MG tablet Take 1 tablet (5 mg total) by mouth daily. (Patient taking differently: Take 10 mg by mouth daily. ) 30 tablet 3  . methocarbamol (ROBAXIN) 500 MG tablet Take 1 tablet (500 mg total) by mouth 2 (two) times daily. 20 tablet 0  . ondansetron (ZOFRAN) 4 MG tablet Take 4 mg by mouth every 8 (eight) hours as needed for nausea or vomiting.    Marland Kitchen oxyCODONE-acetaminophen (PERCOCET/ROXICET) 5-325 MG tablet Take by mouth every 4 (four) hours as needed for severe pain.    . promethazine (PHENERGAN) 25 MG tablet Take by mouth every 6 (six) hours as needed for nausea or vomiting.    Marland Kitchen tiZANidine (ZANAFLEX) 4 MG tablet Take 4 mg by mouth every 6 (six) hours as needed for muscle spasms.    Marland Kitchen topiramate (TOPAMAX) 25 MG capsule Take 25 mg by mouth 2 (two) times daily.     No current facility-administered medications on file prior to visit.     Allergies  Allergen Reactions  . Ambien [Zolpidem Tartrate] Other (See Comments)    PTSD, nightmares   . Dilantin [Phenytoin]     Intravenously - it burns   . Naproxen Hives     Objective:  BP (!) 158/99   Pulse (!) 114   Temp 98.7 F (37.1 C) (Oral)   Resp 17   Ht 5' 2.5" (1.588 m)   Wt 206 lb (93.4 kg)   LMP 11/20/2016 (Approximate)   SpO2 98%   BMI  37.08 kg/m   Physical Exam  Constitutional: She is oriented to person, place, and time and well-developed, well-nourished, and in no distress. She does not have a sickly appearance. No distress.  obese  Cardiovascular: Normal rate, regular rhythm and normal heart sounds.   Neurological: She is alert and oriented to person, place, and time. GCS score is 15.  Skin: Skin is warm and dry.  Psychiatric: Mood, memory, affect and judgment normal.  Vitals reviewed.  Lab Results  Component Value Date   TSH 2.050 10/02/2016   Lab Results  Component Value Date   WBC 6.4 10/02/2016   HCT 40.4 10/02/2016   MCV 87 10/02/2016   PLT 251 10/02/2016    Assessment  and Plan :  1. Essential hypertension 2. Elevated blood pressure reading 3. Tachycardia - Ambulatory referral to diabetic education - Recheck vitals - Catecholamine+VMA, 24-Hr Urine - Pt c/o intermittent elevated blood pressure readings over the past few months. She is on 10mg  lisinopril. Today's bp was elevated, repeat is wnl. Recent labs and EKG are nml. Concern for possible pheochromocytoma. Will send home with 24-hour urine. Will await results. Asked her to keep log of home blood pressures.   Mercer Pod, PA-C  [facility] 12/11/2016 2:23 PM

## 2016-12-11 NOTE — Telephone Encounter (Signed)
MCVEY HUSBAND CALLING IN TO LET us KNOW THAT WIFE BP HAS BEEN ELEVATED THE PT PROVIDER DOCTOR HOLLMAN  FROM [place] GOT ON THE PHONE STATING THAT HE HAS CHECKED PT BP TWICE SINCE SHE HAS BEEN THERE ITS BEEN 185/122 AND 163/96 IT IS CRUCIAL THAT PATIENT BE SEEN TODAY AND THAT HER BP HAS TO BE CHANGED HE HAS BEEN LOOKING AT PT RECORDS OF BP READINGS FOR THE LAST 6 MONTHS AND SOMETHING HAS TO BE DONE ABOUT BP MEDICINE THE PT MADE AN APPOINTMENT FOR TODAY PER DR [facility]

## 2016-12-20 LAB — CATECHOLAMINE+VMA, 24-HR URINE
Dopamine , 24H Ur: 289 ug/(24.h) (ref 0–510)
Dopamine, Rand Ur: 361 ug/L
Epinephrine, 24H Ur: 3 ug/24 hr (ref 0–20)
Epinephrine, Rand Ur: 4 ug/L
Norepinephrine, 24H Ur: 70 ug/(24.h) (ref 0–135)
Norepinephrine, Rand Ur: 88 ug/L
VMA, 24H Ur Adult: 3.7 mg/(24.h) (ref 0.0–7.5)
VMA, Urine: 4.6 mg/L

## 2016-12-26 ENCOUNTER — Other Ambulatory Visit: Payer: Self-pay | Admitting: Physician Assistant

## 2016-12-26 ENCOUNTER — Encounter: Payer: Self-pay | Admitting: Physician Assistant

## 2016-12-26 DIAGNOSIS — I10 Essential (primary) hypertension: Secondary | ICD-10-CM

## 2016-12-26 DIAGNOSIS — R002 Palpitations: Secondary | ICD-10-CM

## 2016-12-26 DIAGNOSIS — R030 Elevated blood-pressure reading, without diagnosis of hypertension: Secondary | ICD-10-CM

## 2016-12-26 NOTE — Progress Notes (Signed)
Pt is experiencing palpitations and fluctuating blood pressure. Will refer to cardiology.

## 2017-01-03 ENCOUNTER — Ambulatory Visit (INDEPENDENT_AMBULATORY_CARE_PROVIDER_SITE_OTHER): Payer: BLUE CROSS/BLUE SHIELD | Admitting: Cardiovascular Disease

## 2017-01-03 ENCOUNTER — Encounter: Payer: Self-pay | Admitting: Cardiovascular Disease

## 2017-01-03 VITALS — BP 153/105 | HR 119 | Ht 62.0 in | Wt 207.6 lb

## 2017-01-03 DIAGNOSIS — I10 Essential (primary) hypertension: Secondary | ICD-10-CM

## 2017-01-03 DIAGNOSIS — R400 Somnolence: Secondary | ICD-10-CM | POA: Diagnosis not present

## 2017-01-03 DIAGNOSIS — G4733 Obstructive sleep apnea (adult) (pediatric): Secondary | ICD-10-CM

## 2017-01-03 MED ORDER — LISINOPRIL 20 MG PO TABS: 20.0000 mg | ORAL_TABLET | Freq: Every day | ORAL | 6 refills | Status: DC

## 2017-01-03 NOTE — Progress Notes (Signed)
Epworth Sleepiness Scale Score: 6  --I have high blood pressure --I have had Insomnia --I feel excessively sleepy and tired during the day --I have been told that I snore --I am overweight or am gaining weight --I have problems with memory/concentration --I awake feeling not rested --I frequently awake with headaches

## 2017-01-03 NOTE — Progress Notes (Signed)
01/03/2017 Kristine Kelley   11-22-1990  [id number]  Primary Physician McVey, Gelene Mink, PA-C Primary Cardiologist: Lorretta Harp MD Renae Gloss  HPI:  Kristine Kelley is a 26 year old mildly overweight married Caucasian female comes in by her husband Kristine Kelley and her systolic. She has a service Dog because of PTSD. She has a history of epilepsy, pituitary tumor, migraines, peripheral neuropathy, asthma, PTSD and recently recognized hypertension. She currently is on lisinopril. She does not smoke and drinks socially. Her weight has increased steadily over the last 5 years approximately 30 pounds. She does relate symptoms compatible with obstructive sleep apnea.   Current Outpatient Prescriptions  Medication Sig Dispense Refill  . Diclofenac Potassium (CAMBIA PO) Take by mouth.    . DULoxetine (CYMBALTA) 60 MG capsule Take 60 mg by mouth daily.    . fentaNYL (DURAGESIC - DOSED MCG/HR) 25 MCG/HR patch Place 25 mcg onto the skin every 3 (three) days.    Marland Kitchen lamoTRIgine (LAMICTAL) 150 MG tablet Take 150 mg by mouth daily.    Marland Kitchen lisinopril (PRINIVIL,ZESTRIL) 20 MG tablet Take 1 tablet (20 mg total) by mouth daily. 30 tablet 6  . methocarbamol (ROBAXIN) 500 MG tablet Take 1 tablet (500 mg total) by mouth 2 (two) times daily. 20 tablet 0  . ondansetron (ZOFRAN) 4 MG tablet Take 4 mg by mouth every 8 (eight) hours as needed for nausea or vomiting.    Marland Kitchen oxyCODONE-acetaminophen (PERCOCET/ROXICET) 5-325 MG tablet Take by mouth every 4 (four) hours as needed for severe pain.    Marland Kitchen tiZANidine (ZANAFLEX) 4 MG tablet Take 4 mg by mouth every 6 (six) hours as needed for muscle spasms.     No current facility-administered medications for this visit.     Allergies  Allergen Reactions  . Ambien [Zolpidem Tartrate] Other (See Comments)    PTSD, nightmares   . Dilantin [Phenytoin]     Intravenously - it burns   . Naproxen Hives    Social History   Social History  . Marital status:  Married    Spouse name: N/A  . Number of children: N/A  . Years of education: N/A   Occupational History  . Not on file.   Social History Main Topics  . Smoking status: Never Smoker  . Smokeless tobacco: Never Used  . Alcohol use No  . Drug use: No  . Sexual activity: No   Other Topics Concern  . Not on file   Social History Narrative  . No narrative on file     Review of Systems: General: negative for chills, fever, night sweats or weight changes.  Cardiovascular: negative for chest pain, dyspnea on exertion, edema, orthopnea, palpitations, paroxysmal nocturnal dyspnea or shortness of breath Dermatological: negative for rash Respiratory: negative for cough or wheezing Urologic: negative for hematuria Abdominal: negative for nausea, vomiting, diarrhea, bright red blood per rectum, melena, or hematemesis Neurologic: negative for visual changes, syncope, or dizziness All other systems reviewed and are otherwise negative except as noted above.    Blood pressure (!) 153/105, pulse (!) 119, height 5\' 2"  (1.575 m), weight 207 lb 9.6 oz (94.2 kg).  General appearance: alert and no distress Neck: no adenopathy, no carotid bruit, no JVD, supple, symmetrical, trachea midline and thyroid not enlarged, symmetric, no tenderness/mass/nodules Lungs: clear to auscultation bilaterally Heart: regular rate and rhythm, S1, S2 normal, no murmur, click, rub or gallop Extremities: extremities normal, atraumatic, no cyanosis or edema  EKG Not performed today  ASSESSMENT AND PLAN:   Essential hypertension Kristine Kelley is a 26 year old moderately overweight Caucasian female referred by her PCP for evaluation of difficult control hypertension. She does have a fairly extensive past medical history for epilepsy, PTSD, migraines, asthma and more recently hypertension was which was recognized last several months. She is gained approximately 30 pounds over the last 5 years. She does have vertebral  fractures and is in chronic pain. She gives a history compatible with obstructive sleep apnea as well. She showed me her blood pressure log which shows consistently elevated diastolic blood pressures above 100. We talked about the importance of salt restriction. She has had a workup for pheochromocytoma which was negative. I'm going to increase her lisinopril from 10-20 mg a day, check a basic metabolic panel in several weeks and have her see Erasmo Downer back in 1 month for review and titration. I think that her hypertension is multifactorial Related to increased weight, sleep apnea and chronic pain.  Obstructive sleep apnea The patient is significantly overweight and complains of nighttime snoring and daytime somnolence. I suspect she has obstructive sleep apnea based on her symptoms and body habitus which may be contributing to the difficulty in controlling her blood pressure. We will obtain an outpatient sleep study.      Lorretta Harp MD FACP,FACC,FAHA, [facility] 01/03/2017 12:01 PM

## 2017-01-03 NOTE — Patient Instructions (Addendum)
Medication Instructions: Increase Lisinopril to 20 mg daily.   Labwork: Your physician recommends that you return for lab work in: 2-3 weeks--BMET  Testing: Your physician has recommended that you have a sleep study. This test records several body functions during sleep, including: brain activity, eye movement, oxygen and carbon dioxide blood levels, heart rate and rhythm, breathing rate and rhythm, the flow of air through your mouth and nose, snoring, body muscle movements, and chest and belly movement.  Follow-Up: Your physician recommends that you schedule a follow-up appointment in: 1 month with HTN Clinic. Your physician has requested that you regularly monitor and record your blood pressure readings at home. Please use the same machine at the same time of day to check your readings and record them to bring to your follow-up visit.  Your physician wants you to follow-up in: 6 months with Dr. Gwenlyn Found. You will receive a reminder letter in the mail two months in advance. If you don't receive a letter, please call our office to schedule the follow-up appointment.  If you need a refill on your cardiac medications before your next appointment, please call your pharmacy.

## 2017-01-03 NOTE — Addendum Note (Signed)
Addended by: Therisa Doyne on: 01/03/2017 12:05 PM   Modules accepted: Orders

## 2017-01-03 NOTE — Assessment & Plan Note (Signed)
The patient is significantly overweight and complains of nighttime snoring and daytime somnolence. I suspect she has obstructive sleep apnea based on her symptoms and body habitus which may be contributing to the difficulty in controlling her blood pressure. We will obtain an outpatient sleep study.

## 2017-01-03 NOTE — Assessment & Plan Note (Signed)
Kristine Kelley is a 26 year old moderately overweight Caucasian female referred by her PCP for evaluation of difficult control hypertension. She does have a fairly extensive past medical history for epilepsy, PTSD, migraines, asthma and more recently hypertension was which was recognized last several months. She is gained approximately 30 pounds over the last 5 years. She does have vertebral fractures and is in chronic pain. She gives a history compatible with obstructive sleep apnea as well. She showed me her blood pressure log which shows consistently elevated diastolic blood pressures above 100. We talked about the importance of salt restriction. She has had a workup for pheochromocytoma which was negative. I'm going to increase her lisinopril from 10-20 mg a day, check a basic metabolic panel in several weeks and have her see Erasmo Downer back in 1 month for review and titration. I think that her hypertension is multifactorial Related to increased weight, sleep apnea and chronic pain.

## 2017-01-06 ENCOUNTER — Ambulatory Visit (INDEPENDENT_AMBULATORY_CARE_PROVIDER_SITE_OTHER): Payer: BLUE CROSS/BLUE SHIELD | Admitting: Physician Assistant

## 2017-01-06 ENCOUNTER — Encounter: Payer: Self-pay | Admitting: Physician Assistant

## 2017-01-06 VITALS — BP 150/110 | HR 94 | Temp 98.3°F | Resp 16 | Ht 62.0 in | Wt 203.8 lb

## 2017-01-06 DIAGNOSIS — Z30019 Encounter for initial prescription of contraceptives, unspecified: Secondary | ICD-10-CM | POA: Diagnosis not present

## 2017-01-06 DIAGNOSIS — G479 Sleep disorder, unspecified: Secondary | ICD-10-CM

## 2017-01-06 DIAGNOSIS — M545 Low back pain, unspecified: Secondary | ICD-10-CM

## 2017-01-06 DIAGNOSIS — R062 Wheezing: Secondary | ICD-10-CM | POA: Diagnosis not present

## 2017-01-06 DIAGNOSIS — Z87898 Personal history of other specified conditions: Secondary | ICD-10-CM

## 2017-01-06 DIAGNOSIS — Z91018 Allergy to other foods: Secondary | ICD-10-CM | POA: Diagnosis not present

## 2017-01-06 DIAGNOSIS — G8929 Other chronic pain: Secondary | ICD-10-CM

## 2017-01-06 DIAGNOSIS — R030 Elevated blood-pressure reading, without diagnosis of hypertension: Secondary | ICD-10-CM

## 2017-01-06 DIAGNOSIS — R110 Nausea: Secondary | ICD-10-CM | POA: Diagnosis not present

## 2017-01-06 DIAGNOSIS — I10 Essential (primary) hypertension: Secondary | ICD-10-CM | POA: Diagnosis not present

## 2017-01-06 DIAGNOSIS — J302 Other seasonal allergic rhinitis: Secondary | ICD-10-CM

## 2017-01-06 DIAGNOSIS — Z79899 Other long term (current) drug therapy: Secondary | ICD-10-CM | POA: Diagnosis not present

## 2017-01-06 MED ORDER — ONDANSETRON 4 MG PO TBDP: 4.0000 mg | ORAL_TABLET | Freq: Three times a day (TID) | ORAL | 0 refills | Status: DC | PRN

## 2017-01-06 MED ORDER — MONTELUKAST SODIUM 10 MG PO TABS: 10.0000 mg | ORAL_TABLET | Freq: Every day | ORAL | 3 refills | Status: DC

## 2017-01-06 MED ORDER — EPINEPHRINE 0.15 MG/0.15ML IJ SOAJ: 0.1500 mg | INTRAMUSCULAR | 0 refills | Status: DC | PRN

## 2017-01-06 MED ORDER — DESOGESTREL-ETHINYL ESTRADIOL 0.15-30 MG-MCG PO TABS: 1.0000 | ORAL_TABLET | Freq: Every day | ORAL | 11 refills | Status: DC

## 2017-01-06 MED ORDER — HYDROXYZINE PAMOATE 50 MG PO CAPS: 50.0000 mg | ORAL_CAPSULE | Freq: Three times a day (TID) | ORAL | 1 refills | Status: DC | PRN

## 2017-01-06 MED ORDER — ALBUTEROL SULFATE HFA 108 (90 BASE) MCG/ACT IN AERS: 2.0000 | INHALATION_SPRAY | RESPIRATORY_TRACT | 1 refills | Status: DC | PRN
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
## 2017-01-06 NOTE — Patient Instructions (Addendum)
Come back and see me in 6 months.  You will receive a phone call to schedule your appt with physical therapy, Neurology and pain clinic.  Please work hard on your eating habits and exercise.   Thank you for coming in today. I hope you feel we met your needs.  Feel free to call [facility] if you have any questions or further requests.  Please consider signing up for MyChart if you do not already have it, as this is a great way to communicate with me.  Best,  Whitney McVey, PA-C   DASH Eating Plan DASH stands for "Dietary Approaches to Stop Hypertension." The DASH eating plan is a healthy eating plan that has been shown to reduce high blood pressure (hypertension). It may also reduce your risk for type 2 diabetes, heart disease, and stroke. The DASH eating plan may also help with weight loss. What are tips for following this plan? General guidelines  Avoid eating more than 2,300 mg (milligrams) of salt (sodium) a day. If you have hypertension, you may need to reduce your sodium intake to 1,500 mg a day.  Limit alcohol intake to no more than 1 drink a day for nonpregnant women and 2 drinks a day for men. One drink equals 12 oz of beer, 5 oz of wine, or 1 oz of hard liquor.  Work with your health care provider to maintain a healthy body weight or to lose weight. Ask what an ideal weight is for you.  Get at least 30 minutes of exercise that causes your heart to beat faster (aerobic exercise) most days of the week. Activities may include walking, swimming, or biking.  Work with your health care provider or diet and nutrition specialist (dietitian) to adjust your eating plan to your individual calorie needs. Reading food labels  Check food labels for the amount of sodium per serving. Choose foods with less than 5 percent of the Daily Value of sodium. Generally, foods with less than 300 mg of sodium per serving fit into this eating plan.  To find whole grains, look for the word "whole" as the first  word in the ingredient list. Shopping  Buy products labeled as "low-sodium" or "no salt added."  Buy fresh foods. Avoid canned foods and premade or frozen meals. Cooking  Avoid adding salt when cooking. Use salt-free seasonings or herbs instead of table salt or sea salt. Check with your health care provider or pharmacist before using salt substitutes.  Do not fry foods. Cook foods using healthy methods such as baking, boiling, grilling, and broiling instead.  Cook with heart-healthy oils, such as olive, canola, soybean, or sunflower oil. Meal planning   Eat a balanced diet that includes: ? 5 or more servings of fruits and vegetables each day. At each meal, try to fill half of your plate with fruits and vegetables. ? Up to 6-8 servings of whole grains each day. ? Less than 6 oz of lean meat, poultry, or fish each day. A 3-oz serving of meat is about the same size as a deck of cards. One egg equals 1 oz. ? 2 servings of low-fat dairy each day. ? A serving of nuts, seeds, or beans 5 times each week. ? Heart-healthy fats. Healthy fats called Omega-3 fatty acids are found in foods such as flaxseeds and coldwater fish, like sardines, salmon, and mackerel.  Limit how much you eat of the following: ? Canned or prepackaged foods. ? Food that is high in trans fat, such as  fried foods. ? Food that is high in saturated fat, such as fatty meat. ? Sweets, desserts, sugary drinks, and other foods with added sugar. ? Full-fat dairy products.  Do not salt foods before eating.  Try to eat at least 2 vegetarian meals each week.  Eat more home-cooked food and less restaurant, buffet, and fast food.  When eating at a restaurant, ask that your food be prepared with less salt or no salt, if possible. What foods are recommended? The items listed may not be a complete list. Talk with your dietitian about what dietary choices are best for you. Grains Whole-grain or whole-wheat bread. Whole-grain or  whole-wheat pasta. Brown rice. [name]. Bulgur. Whole-grain and low-sodium cereals. Pita bread. Low-fat, low-sodium crackers. Whole-wheat flour tortillas. Vegetables Fresh or frozen vegetables (raw, steamed, roasted, or grilled). Low-sodium or reduced-sodium tomato and vegetable juice. Low-sodium or reduced-sodium tomato sauce and tomato paste. Low-sodium or reduced-sodium canned vegetables. Fruits All fresh, dried, or frozen fruit. Canned fruit in natural juice (without added sugar). Meat and other protein foods Skinless chicken or [place]. Ground chicken or [place]. Pork with fat trimmed off. Fish and seafood. Egg whites. Dried beans, peas, or lentils. Unsalted nuts, nut butters, and seeds. Unsalted canned beans. Lean cuts of beef with fat trimmed off. Low-sodium, lean deli meat. Dairy Low-fat (1%) or fat-free (skim) milk. Fat-free, low-fat, or reduced-fat cheeses. Nonfat, low-sodium ricotta or cottage cheese. Low-fat or nonfat yogurt. Low-fat, low-sodium cheese. Fats and oils Soft margarine without trans fats. Vegetable oil. Low-fat, reduced-fat, or light mayonnaise and salad dressings (reduced-sodium). Canola, safflower, olive, soybean, and sunflower oils. Avocado. Seasoning and other foods Herbs. Spices. Seasoning mixes without salt. Unsalted popcorn and pretzels. Fat-free sweets. What foods are not recommended? The items listed may not be a complete list. Talk with your dietitian about what dietary choices are best for you. Grains Baked goods made with fat, such as croissants, muffins, or some breads. Dry pasta or rice meal packs. Vegetables Creamed or fried vegetables. Vegetables in a cheese sauce. Regular canned vegetables (not low-sodium or reduced-sodium). Regular canned tomato sauce and paste (not low-sodium or reduced-sodium). Regular tomato and vegetable juice (not low-sodium or reduced-sodium). [name]. Olives. Fruits Canned fruit in a light or heavy syrup. Fried fruit. Fruit  in cream or butter sauce. Meat and other protein foods Fatty cuts of meat. Ribs. Fried meat. [name]. Sausage. Bologna and other processed lunch meats. Salami. Fatback. Hotdogs. Bratwurst. Salted nuts and seeds. Canned beans with added salt. Canned or smoked fish. Whole eggs or egg yolks. Chicken or [place] with skin. Dairy Whole or 2% milk, cream, and half-and-half. Whole or full-fat cream cheese. Whole-fat or sweetened yogurt. Full-fat cheese. Nondairy creamers. Whipped toppings. Processed cheese and cheese spreads. Fats and oils Butter. Stick margarine. Lard. Shortening. Ghee. Bacon fat. Tropical oils, such as coconut, palm kernel, or palm oil. Seasoning and other foods Salted popcorn and pretzels. Onion salt, garlic salt, seasoned salt, table salt, and sea salt. Worcestershire sauce. Tartar sauce. Barbecue sauce. Teriyaki sauce. Soy sauce, including reduced-sodium. Steak sauce. Canned and packaged gravies. Fish sauce. Oyster sauce. Cocktail sauce. Horseradish that you find on the shelf. Ketchup. Mustard. Meat flavorings and tenderizers. Bouillon cubes. Hot sauce and Tabasco sauce. Premade or packaged marinades. Premade or packaged taco seasonings. Relishes. Regular salad dressings. Where to find more information:  National Heart, Lung, and [place]: https://wilson-eaton.com/  American Heart Association: www.heart.org Summary  The DASH eating plan is a healthy eating plan that has been shown to  reduce high blood pressure (hypertension). It may also reduce your risk for type 2 diabetes, heart disease, and stroke.  With the DASH eating plan, you should limit salt (sodium) intake to 2,300 mg a day. If you have hypertension, you may need to reduce your sodium intake to 1,500 mg a day.  When on the DASH eating plan, aim to eat more fresh fruits and vegetables, whole grains, lean proteins, low-fat dairy, and heart-healthy fats.  Work with your health care provider or diet and nutrition specialist  (dietitian) to adjust your eating plan to your individual calorie needs. This information is not intended to replace advice given to you by your health care provider. Make sure you discuss any questions you have with your health care provider. Document Released: 06/20/2011 Document Revised: 06/24/2016 Document Reviewed: 06/24/2016 Elsevier Interactive Patient Education  2017 [employer].  Insomnia Insomnia is a sleep disorder that makes it difficult to fall asleep or to stay asleep. Insomnia can cause tiredness (fatigue), low energy, difficulty concentrating, mood swings, and poor performance at work or school. There are three different ways to classify insomnia:  Difficulty falling asleep.  Difficulty staying asleep.  Waking up too early in the morning.  Any type of insomnia can be long-term (chronic) or short-term (acute). Both are common. Short-term insomnia usually lasts for three months or less. Chronic insomnia occurs at least three times a week for longer than three months. What are the causes? Insomnia may be caused by another condition, situation, or substance, such as:  Anxiety.  Certain medicines.  Gastroesophageal reflux disease (GERD) or other gastrointestinal conditions.  Asthma or other breathing conditions.  Restless legs syndrome, sleep apnea, or other sleep disorders.  Chronic pain.  Menopause. This may include hot flashes.  Stroke.  Abuse of alcohol, tobacco, or illegal drugs.  Depression.  Caffeine.  Neurological disorders, such as Alzheimer disease.  An overactive thyroid (hyperthyroidism).  The cause of insomnia may not be known. What increases the risk? Risk factors for insomnia include:  Gender. Women are more commonly affected than men.  Age. Insomnia is more common as you get older.  Stress. This may involve your professional or personal life.  Income. Insomnia is more common in people with lower income.  Lack of  exercise.  Irregular work schedule or night shifts.  Traveling between different time zones.  What are the signs or symptoms? If you have insomnia, trouble falling asleep or trouble staying asleep is the main symptom. This may lead to other symptoms, such as:  Feeling fatigued.  Feeling nervous about going to sleep.  Not feeling rested in the morning.  Having trouble concentrating.  Feeling irritable, anxious, or depressed.  How is this treated? Treatment for insomnia depends on the cause. If your insomnia is caused by an underlying condition, treatment will focus on addressing the condition. Treatment may also include:  Medicines to help you sleep.  Counseling or therapy.  Lifestyle adjustments.  Follow these instructions at home:  Take medicines only as directed by your health care provider.  Keep regular sleeping and waking hours. Avoid naps.  Keep a sleep diary to help you and your health care provider figure out what could be causing your insomnia. Include: ? When you sleep. ? When you wake up during the night. ? How well you sleep. ? How rested you feel the next day. ? Any side effects of medicines you are taking. ? What you eat and drink.  Make your bedroom a comfortable place  where it is easy to fall asleep: ? Put up shades or special blackout curtains to block light from outside. ? Use a white noise machine to block noise. ? Keep the temperature cool.  Exercise regularly as directed by your health care provider. Avoid exercising right before bedtime.  Use relaxation techniques to manage stress. Ask your health care provider to suggest some techniques that may work well for you. These may include: ? Breathing exercises. ? Routines to release muscle tension. ? Visualizing peaceful scenes.  Cut back on alcohol, caffeinated beverages, and cigarettes, especially close to bedtime. These can disrupt your sleep.  Do not overeat or eat spicy foods right before  bedtime. This can lead to digestive discomfort that can make it hard for you to sleep.  Limit screen use before bedtime. This includes: ? Watching TV. ? Using your smartphone, tablet, and computer.  Stick to a routine. This can help you fall asleep faster. Try to do a quiet activity, brush your teeth, and go to bed at the same time each night.  Get out of bed if you are still awake after 15 minutes of trying to sleep. Keep the lights down, but try reading or doing a quiet activity. When you feel sleepy, go back to bed.  Make sure that you drive carefully. Avoid driving if you feel very sleepy.  Keep all follow-up appointments as directed by your health care provider. This is important. Contact a health care provider if:  You are tired throughout the day or have trouble in your daily routine due to sleepiness.  You continue to have sleep problems or your sleep problems get worse. Get help right away if:  You have serious thoughts about hurting yourself or someone else. This information is not intended to replace advice given to you by your health care provider. Make sure you discuss any questions you have with your health care provider. Document Released: 06/28/2000 Document Revised: 12/01/2015 Document Reviewed: 04/01/2014 Elsevier Interactive Patient Education  2018 [employer].   IF you received an x-ray today, you will receive an invoice from [facility]. Please contact [facility] at [phone] with questions or concerns regarding your invoice.   IF you received labwork today, you will receive an invoice from [facility]. Please contact [facility] at [phone] with questions or concerns regarding your invoice.   Our billing staff will not be able to assist you with questions regarding bills from these companies.  You will be contacted with the lab results as soon as they are available. The fastest way to get your results is to activate your My Chart account.  Instructions are located on the last page of this paperwork. If you have not heard from [place] regarding the results in 2 weeks, please contact this office.

## 2017-01-14 ENCOUNTER — Telehealth: Payer: Self-pay | Admitting: Cardiovascular Disease

## 2017-01-14 NOTE — Telephone Encounter (Signed)
Spoke with pt husband, she took her lisinopril 20 mg at bedtime last night. For the last several days her bp was 150/100 but today it is low. Explained she will need to increase fluids today but to avoid excessive sodium. Encouraged patient to take her bp prior to taking the lisinopril in the evening. If it is low to take 1/2 of the lisinopril dose. If bp is okay then take the whole dose. Patient voiced understanding they will call back with concerns.

## 2017-01-14 NOTE — Telephone Encounter (Signed)
New message     Pt c/o BP issue: STAT if pt c/o blurred vision, one-sided weakness or slurred speech  1. What are your last 5 BP readings?  94/59 this morning, Legrand Como said it fluctuates goes high then low , then back high  2. Are you having any other symptoms (ex. Dizziness, headache, blurred vision, passed out)?  Pt states she can barely keep her eyes open  3. What is your BP issue? When her bp is low she can not fuction

## 2017-01-20 ENCOUNTER — Emergency Department (HOSPITAL_BASED_OUTPATIENT_CLINIC_OR_DEPARTMENT_OTHER)
Admission: EM | Admit: 2017-01-20 | Discharge: 2017-01-20 | Disposition: A | Discharge status: Home / Self Care | Payer: BLUE CROSS/BLUE SHIELD | Attending: Emergency Medicine | Admitting: Emergency Medicine

## 2017-01-20 ENCOUNTER — Telehealth: Payer: Self-pay

## 2017-01-20 ENCOUNTER — Encounter: Payer: Self-pay | Admitting: Physician Assistant

## 2017-01-20 ENCOUNTER — Emergency Department (HOSPITAL_BASED_OUTPATIENT_CLINIC_OR_DEPARTMENT_OTHER): Payer: BLUE CROSS/BLUE SHIELD

## 2017-01-20 ENCOUNTER — Encounter (HOSPITAL_BASED_OUTPATIENT_CLINIC_OR_DEPARTMENT_OTHER): Payer: Self-pay | Admitting: Emergency Medicine

## 2017-01-20 DIAGNOSIS — I10 Essential (primary) hypertension: Secondary | ICD-10-CM | POA: Insufficient documentation

## 2017-01-20 DIAGNOSIS — Z79899 Other long term (current) drug therapy: Secondary | ICD-10-CM | POA: Diagnosis not present

## 2017-01-20 DIAGNOSIS — R42 Dizziness and giddiness: Secondary | ICD-10-CM

## 2017-01-20 DIAGNOSIS — I952 Hypotension due to drugs: Secondary | ICD-10-CM

## 2017-01-20 DIAGNOSIS — J45909 Unspecified asthma, uncomplicated: Secondary | ICD-10-CM | POA: Diagnosis not present

## 2017-01-20 DIAGNOSIS — N289 Disorder of kidney and ureter, unspecified: Secondary | ICD-10-CM | POA: Diagnosis not present

## 2017-01-20 HISTORY — DX: Post-traumatic stress disorder, unspecified: F43.10

## 2017-01-20 LAB — COMPREHENSIVE METABOLIC PANEL
ALK PHOS: 55 U/L (ref 38–126)
ALT: 68 U/L — AB (ref 14–54)
AST: 45 U/L — AB (ref 15–41)
Albumin: 4.5 g/dL (ref 3.5–5.0)
Anion gap: 9 (ref 5–15)
BILIRUBIN TOTAL: 0.7 mg/dL (ref 0.3–1.2)
BUN: 23 mg/dL — AB (ref 6–20)
CALCIUM: 9.2 mg/dL (ref 8.9–10.3)
CHLORIDE: 102 mmol/L (ref 101–111)
CO2: 24 mmol/L (ref 22–32)
CREATININE: 1.25 mg/dL — AB (ref 0.44–1.00)
GFR calc Af Amer: >60 mL/min (ref >60)
GFR, EST NON AFRICAN AMERICAN: 58 mL/min — AB (ref >60)
Glucose, Bld: 98 mg/dL (ref 65–99)
Potassium: 4.3 mmol/L (ref 3.5–5.1)
Sodium: 135 mmol/L (ref 135–145)
TOTAL PROTEIN: 7.4 g/dL (ref 6.5–8.1)

## 2017-01-20 LAB — URINALYSIS, MICROSCOPIC (REFLEX)

## 2017-01-20 LAB — TROPONIN I

## 2017-01-20 LAB — URINALYSIS, ROUTINE W REFLEX MICROSCOPIC
Bilirubin Urine: NEGATIVE
GLUCOSE, UA: NEGATIVE mg/dL
Ketones, ur: 15 mg/dL — AB
Leukocytes, UA: NEGATIVE
Nitrite: NEGATIVE
PROTEIN: 30 mg/dL — AB
Specific Gravity, Urine: 1.031 — ABNORMAL HIGH (ref 1.005–1.030)
pH: 5.5 (ref 5.0–8.0)

## 2017-01-20 LAB — CBC WITH DIFFERENTIAL/PLATELET
BASOS ABS: 0 10*3/uL (ref 0.0–0.1)
BASOS PCT: 0 %
EOS PCT: 5 %
Eosinophils Absolute: 0.3 10*3/uL (ref 0.0–0.7)
HEMATOCRIT: 39.7 % (ref 36.0–46.0)
Hemoglobin: 13.8 g/dL (ref 12.0–15.0)
Lymphocytes Relative: 37 %
Lymphs Abs: 2.2 10*3/uL (ref 0.7–4.0)
MCH: 30.7 pg (ref 26.0–34.0)
MCHC: 34.8 g/dL (ref 30.0–36.0)
MCV: 88.2 fL (ref 78.0–100.0)
MONO ABS: 0.5 10*3/uL (ref 0.1–1.0)
Monocytes Relative: 9 %
Neutro Abs: 3 10*3/uL (ref 1.7–7.7)
Neutrophils Relative %: 49 %
Platelets: 239 10*3/uL (ref 150–400)
RBC: 4.5 MIL/uL (ref 3.87–5.11)
RDW: 12.2 % (ref 11.5–15.5)
WBC: 6 10*3/uL (ref 4.0–10.5)

## 2017-01-20 LAB — PREGNANCY, URINE: Preg Test, Ur: NEGATIVE

## 2017-01-20 MED ORDER — SODIUM CHLORIDE 0.9 % IV BOLUS (SEPSIS)
1000.0000 mL | Freq: Once | INTRAVENOUS | Status: AC
  Administered 2017-01-20: 1000 mL via INTRAVENOUS

## 2017-01-20 NOTE — ED Notes (Signed)
Pt ambulating to bathroom with NT.

## 2017-01-20 NOTE — Discharge Instructions (Signed)
Hold your lisinopril.   You can continue taking your pain meds as needed.   Your kidney function is slightly abnormal. Please repeat kidney function in a week with your doctor.   See your doctor in a week for blood pressure check and repeat kidney function test   Return to ER if you have worse dizziness, hallucinations, passing out, fevers, chest pain

## 2017-01-20 NOTE — Telephone Encounter (Signed)
Patient's husband called and said that his wife woke up this morning feeling badly and that her BP was low 84/52 when checked at home with a BP cuff. Advised patient that she needs to come in to be seen by one of the providers here. Patient's husband stated that she did not want to come in and asked if she drank water if she would be okay. Advised that they just watch and continue to monitor her BP and if continuing to run low and feeling bad after a few hours than she needs to come and be seen by one of our providers or go to the nearest ED.

## 2017-01-20 NOTE — ED Triage Notes (Signed)
Pt states her BP is low and she was hallucinating this morning. Pt is pale. Denies recent illness. Pt states she has been having issues with BP either being too high or too low for several months, has been seeing a cardiologist.

## 2017-01-20 NOTE — ED Provider Notes (Signed)
[place] DEPT [facility] Provider Note   CSN: [id number] Arrival date & time: 01/20/17  1201     History   Chief Complaint Chief Complaint  Patient presents with  . Dizziness    HPI Kristine Kelley is a 26 y.o. female hx of Chronic back pain, PTSD, traumatic brain injury who presenting with hypotension. Patient has a history of hypertension about 3 weeks ago, patient was told to increase lisinopril to 20 mg from 10 mg daily by cardiologist (Dr. Gwenlyn Found). Patient was doing well until this morning when she woke up. She felt very lightheaded and dizzy this morning. Apparently she had some visual hallucinations and saw some spots in her peripheral vision. Patient states that this happened before when her blood pressure was too low. Patient denies any fevers or chills or vomiting or abdominal pain. Patient also denies any suicidal homicidal ideations or worsening depression. Patient does have multiple pain meds prescribed by her pain medicine doctor in probably and denies any overdoses. Of note, patient has not been sleeping well but this is a chronic issue and her doctor has been changing her sleep medicines.     The history is provided by the patient.    Past Medical History:  Diagnosis Date  . Asthma   . Benign tumor of pituitary gland ([place])   . Chronic back pain   . Chronic pain   . PTSD (post-traumatic stress disorder)   . Seizures ([place])   . Traumatic brain injury [facility])     Patient Active Problem List   Diagnosis Date Noted  . Obstructive sleep apnea 01/03/2017  . Essential hypertension 12/11/2016  . Asthma 11/12/2012  . DDD (degenerative disc disease), cervical 11/12/2012  . Epilepsy ([place]) 11/12/2012  . Displacement of lumbar intervertebral disc without myelopathy 11/12/2012  . Migraine with aura 11/12/2012  . 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 Obesity 11/12/2012  . Other chronic pain 11/12/2012  . Tachycardia  11/12/2012  . Kidney stone 11/10/2012  . Posttraumatic stress disorder 11/10/2012  . Seizures ([place]) 07/24/2011    Past Surgical History:  Procedure Laterality Date  . APPENDECTOMY    . KIDNEY STONE SURGERY      OB History    No data available       Home Medications    Prior to Admission medications   Medication Sig Start Date End Date Taking? Authorizing Provider  albuterol (PROVENTIL HFA;VENTOLIN HFA) 108 (90 Base) MCG/ACT inhaler Inhale 2 puffs into the lungs every 4 (four) hours as needed for wheezing or shortness of breath (cough, shortness of breath or wheezing.). 01/06/17   McVey, Gelene Mink, PA-C  desogestrel-ethinyl estradiol (APRI,EMOQUETTE,SOLIA) 0.15-30 MG-MCG tablet Take 1 tablet by mouth daily. 01/06/17   McVey, Gelene Mink, PA-C  Diclofenac Potassium (CAMBIA PO) Take by mouth.    [provider]  DULoxetine (CYMBALTA) 60 MG capsule Take 60 mg by mouth daily.    [provider]  EPINEPHrine 0.15 MG/0.15ML IJ injection Inject 0.15 mLs (0.15 mg total) into the muscle as needed for anaphylaxis. 01/06/17   McVey, Gelene Mink, PA-C  fentaNYL (DURAGESIC - DOSED MCG/HR) 25 MCG/HR patch Place 25 mcg onto the skin every 3 (three) days.    [provider]  hydrOXYzine (VISTARIL) 50 MG capsule Take 1 capsule (50 mg total) by mouth 3 (three) times daily as needed (for sleep). Take 30-60 min before bed. May take 100mg  if needed 01/06/17   McVey, Gelene Mink,  PA-C  lamoTRIgine (LAMICTAL) 150 MG tablet Take 150 mg by mouth daily.    [provider]  lisinopril (PRINIVIL,ZESTRIL) 20 MG tablet Take 1 tablet (20 mg total) by mouth daily. 01/03/17   Lorretta Harp, MD  methocarbamol (ROBAXIN) 500 MG tablet Take 1 tablet (500 mg total) by mouth 2 (two) times daily. 03/29/16   Palumbo, April, MD  montelukast (SINGULAIR) 10 MG tablet Take 1 tablet (10 mg total) by mouth at bedtime. 01/06/17   McVey, Gelene Mink, PA-C  ondansetron  (ZOFRAN ODT) 4 MG disintegrating tablet Take 1 tablet (4 mg total) by mouth every 8 (eight) hours as needed for nausea or vomiting. 01/06/17   McVey, Gelene Mink, PA-C  ondansetron (ZOFRAN) 4 MG tablet Take 4 mg by mouth every 8 (eight) hours as needed for nausea or vomiting.    [provider]  oxyCODONE-acetaminophen (PERCOCET/ROXICET) 5-325 MG tablet Take by mouth every 4 (four) hours as needed for severe pain.    [provider]  tiZANidine (ZANAFLEX) 4 MG tablet Take 4 mg by mouth every 6 (six) hours as needed for muscle spasms.    [provider]    Family History Family History  Problem Relation Age of Onset  . Heart disease Mother   . Cancer Mother   . Drug abuse Mother   . Heart disease Father   . Drug abuse Father     Social History Social History  Substance Use Topics  . Smoking status: Never Smoker  . Smokeless tobacco: Never Used  . Alcohol use No     Allergies   Ambien [zolpidem tartrate]; Dilantin [phenytoin]; and Naproxen   Review of Systems Review of Systems  Neurological: Positive for dizziness.  Psychiatric/Behavioral: Positive for hallucinations.  All other systems reviewed and are negative.    Physical Exam Updated Vital Signs BP (!) 95/47   Pulse 75   Temp 98.2 F (36.8 C) (Oral)   Resp 16   LMP 01/15/2017 (Exact Date)   SpO2 100%   Physical Exam  Constitutional: She is oriented to person, place, and time.  Slightly dehydrated, tired   HENT:  Head: Normocephalic.  MM slightly dry   Eyes: Conjunctivae and EOM are normal. Pupils are equal, round, and reactive to light.  Neck: Normal range of motion. Neck supple.  Cardiovascular: Normal rate, regular rhythm and normal heart sounds.   Pulmonary/Chest: Effort normal and breath sounds normal. No respiratory distress.  Abdominal: Soft. Bowel sounds are normal. She exhibits no distension. There is no tenderness.  Musculoskeletal: Normal range of motion. She  exhibits no edema.  Neurological: She is alert and oriented to person, place, and time. No cranial nerve deficit. Coordination normal.  Skin: Skin is warm.  Psychiatric: She has a normal mood and affect.  Nursing note and vitals reviewed.    ED Treatments / Results  Labs (all labs ordered are listed, but only abnormal results are displayed) Labs Reviewed  COMPREHENSIVE METABOLIC PANEL - Abnormal; Notable for the following:       Result Value   BUN 23 (*)    Creatinine, Ser 1.25 (*)    AST 45 (*)    ALT 68 (*)    GFR calc non Af Amer 58 (*)    All other components within normal limits  URINALYSIS, ROUTINE W REFLEX MICROSCOPIC - Abnormal; Notable for the following:    APPearance CLOUDY (*)    Specific Gravity, Urine 1.031 (*)    Hgb urine dipstick LARGE (*)  Ketones, ur 15 (*)    Protein, ur 30 (*)    All other components within normal limits  URINALYSIS, MICROSCOPIC (REFLEX) - Abnormal; Notable for the following:    Bacteria, UA MANY (*)    Squamous Epithelial / LPF 0-5 (*)    All other components within normal limits  CBC WITH DIFFERENTIAL/PLATELET  TROPONIN I  PREGNANCY, URINE    EKG  EKG Interpretation  Date/Time:  Monday January 20 2017 13:30:24 EDT Ventricular Rate:  86 PR Interval:    QRS Duration: 86 QT Interval:  404 QTC Calculation: 484 R Axis:   77 Text Interpretation:  Sinus rhythm Borderline prolonged QT interval No previous ECGs available Confirmed by Wandra Arthurs [phone]) on 01/20/2017 1:34:31 PM Also confirmed by Wandra Arthurs [phone]), editor Drema Pry [phone])  on 01/20/2017 1:41:49 PM       Radiology Ct Head Wo Contrast  Result Date: 01/20/2017 CLINICAL DATA:  Hypotension, hallucinations, history of traumatic brain injury in seizures. EXAM: CT HEAD WITHOUT CONTRAST TECHNIQUE: Contiguous axial images were obtained from the base of the skull through the vertex without intravenous contrast. COMPARISON:  03/29/2016. FINDINGS: Brain: No evidence of  an acute infarct, acute hemorrhage, mass lesion, mass effect or hydrocephalus. Vascular: No hyperdense vessel or unexpected calcification. Skull: Normal. Negative for fracture or focal lesion. Sinuses/Orbits: No acute finding. Other: None. IMPRESSION: Negative. Electronically Signed   By: Lorin Picket M.D.   On: 01/20/2017 13:15    Procedures Procedures (including critical care time)  Medications Ordered in ED Medications  sodium chloride 0.9 % bolus 1,000 mL (0 mLs Intravenous Stopped 01/20/17 1417)  sodium chloride 0.9 % bolus 1,000 mL (0 mLs Intravenous Stopped 01/20/17 1537)     Initial Impression / Assessment and Plan / ED Course  I have reviewed the triage vital signs and the nursing notes.  Pertinent labs & imaging results that were available during my care of the patient were reviewed by me and considered in my medical decision making (see chart for details).     Kristine Kelley is a 26 y.o. female here with dizziness, hypotension. BP 80-90s in the ED. Slightly orthostatic. I think likely secondary to increased dose of lisinopril. Had some visual hallucinations but denies auditory hallucinations or depression or suicidal ideation. She doesn't appear septic. Will get labs, UA, CT head. Will give IVF.  3:46 PM Cr 1.2, baseline around 0.7. I think mild AKI likely from lisinopril. UA showed 30 protein and no obvious UTI. CT head unremarkable. BP improved to upper 90s after 2 L NS bolus. Felt better. Ambulated to the bathroom with no problems. Will stop BP meds, repeat BMP with PCP next week, follow up with PCP next week for BP check. I think the visual hallucinations likely from the hypotension vs lack of sleep, doesn't need emergent psych eval currently.    Final Clinical Impressions(s) / ED Diagnoses   Final diagnoses:  Dizziness  Hypotension due to drugs  Renal insufficiency    New Prescriptions Discharge Medication List as of 01/20/2017  3:35 PM       Drenda Freeze,  MD 01/20/17 [phone]

## 2017-01-25 ENCOUNTER — Encounter: Payer: Self-pay | Admitting: Physician Assistant

## 2017-01-25 ENCOUNTER — Other Ambulatory Visit: Payer: Self-pay | Admitting: Physician Assistant

## 2017-01-25 DIAGNOSIS — G43819 Other migraine, intractable, without status migrainosus: Secondary | ICD-10-CM

## 2017-01-25 MED ORDER — DICLOFENAC POTASSIUM(MIGRAINE) 50 MG PO PACK: 50.0000 mg | PACK | Freq: Every day | ORAL | 1 refills | Status: DC | PRN

## 2017-02-04 ENCOUNTER — Telehealth: Payer: Self-pay | Admitting: Cardiovascular Disease

## 2017-02-04 ENCOUNTER — Ambulatory Visit: Payer: BLUE CROSS/BLUE SHIELD

## 2017-02-04 NOTE — Progress Notes (Deleted)
02/04/2017 Kristine Kelley [birth year] [id number]   HPI:  Kristine Kelley is a 26 y.o. female patient of Dr Gwenlyn Found, with a [place] below who presents today for hypertension clinic evaluation.  Her medical history includes uncontrolled hypertension, chronic back pain (hx of fractured back x 2, ages 31 and 44), TBI in 2012 with multiple concussions, seizure disorder, migraines, fibromyalgia, seasonal allergies, and insomnia.  She recently re-started using birth control pills when she saw her PCP last month.      Blood Pressure Goal:  130/80   Current Medications:  Lisinopril 20 mg qd  Family Hx:  Social Hx:  Diet:  Exercise:  Limited due to lower back pain  Home BP readings:  Intolerances:   No antihypertensive intolerances  Wt Readings from Last 3 Encounters:  01/06/17 203 lb 12.8 oz (92.4 kg)  01/03/17 207 lb 9.6 oz (94.2 kg)  12/11/16 206 lb (93.4 kg)   BP Readings from Last 3 Encounters:  01/20/17 (!) 95/47  01/06/17 (!) 150/110  01/03/17 (!) 153/105   Pulse Readings from Last 3 Encounters:  01/20/17 75  01/06/17 94  01/03/17 (!) 119    Current Outpatient Prescriptions  Medication Sig Dispense Refill  . albuterol (PROVENTIL HFA;VENTOLIN HFA) 108 (90 Base) MCG/ACT inhaler Inhale 2 puffs into the lungs every 4 (four) hours as needed for wheezing or shortness of breath (cough, shortness of breath or wheezing.). 1 Inhaler 1  . desogestrel-ethinyl estradiol (APRI,EMOQUETTE,SOLIA) 0.15-30 MG-MCG tablet Take 1 tablet by mouth daily. 1 Package 11  . Diclofenac Potassium (CAMBIA) 50 MG PACK Take 50 mg by mouth daily as needed. 1 each 1  . DULoxetine (CYMBALTA) 60 MG capsule Take 60 mg by mouth daily.    Marland Kitchen EPINEPHrine 0.15 MG/0.15ML IJ injection Inject 0.15 mLs (0.15 mg total) into the muscle as needed for anaphylaxis. 2 Device 0  . fentaNYL (DURAGESIC - DOSED MCG/HR) 25 MCG/HR patch Place 25 mcg onto the skin every 3 (three) days.    . hydrOXYzine (VISTARIL) 50 MG capsule Take  1 capsule (50 mg total) by mouth 3 (three) times daily as needed (for sleep). Take 30-60 min before bed. May take 100mg  if needed 60 capsule 1  . lamoTRIgine (LAMICTAL) 150 MG tablet Take 150 mg by mouth daily.    Marland Kitchen lisinopril (PRINIVIL,ZESTRIL) 20 MG tablet Take 1 tablet (20 mg total) by mouth daily. 30 tablet 6  . methocarbamol (ROBAXIN) 500 MG tablet Take 1 tablet (500 mg total) by mouth 2 (two) times daily. 20 tablet 0  . montelukast (SINGULAIR) 10 MG tablet Take 1 tablet (10 mg total) by mouth at bedtime. 30 tablet 3  . ondansetron (ZOFRAN ODT) 4 MG disintegrating tablet Take 1 tablet (4 mg total) by mouth every 8 (eight) hours as needed for nausea or vomiting. 40 tablet 0  . ondansetron (ZOFRAN) 4 MG tablet Take 4 mg by mouth every 8 (eight) hours as needed for nausea or vomiting.    Marland Kitchen oxyCODONE-acetaminophen (PERCOCET/ROXICET) 5-325 MG tablet Take by mouth every 4 (four) hours as needed for severe pain.    Marland Kitchen tiZANidine (ZANAFLEX) 4 MG tablet Take 4 mg by mouth every 6 (six) hours as needed for muscle spasms.     No current facility-administered medications for this visit.     Allergies  Allergen Reactions  . Ambien [Zolpidem Tartrate] Other (See Comments)    PTSD, nightmares   . Dilantin [Phenytoin]     Intravenously - it burns   . Naproxen  Hives    Past Medical History:  Diagnosis Date  . Asthma   . Benign tumor of pituitary gland ([place])   . Chronic back pain   . Chronic pain   . PTSD (post-traumatic stress disorder)   . Seizures ([place])   . Traumatic brain injury ([place])     Last menstrual period 01/15/2017.  No problem-specific Assessment & Plan notes found for this encounter.   Tommy Medal PharmD CPP [facility]

## 2017-02-04 NOTE — Telephone Encounter (Signed)
Left a message to call back. The patient needs a follow up appointment with the HTN clinic. She had one today but did not show up. Per pharmd, she can be scheduled Wednesday or Thursday.

## 2017-02-04 NOTE — Telephone Encounter (Signed)
New message    Pt c/o BP issue: STAT if pt c/o blurred vision, one-sided weakness or slurred speech  1. What are your last 5 BP readings? Last week-74/48 last night-158/110 now-110/60  2. Are you having any other symptoms (ex. Dizziness, headache, blurred vision, passed out)? When low-out of it, hallucinations, sweating  3. What is your BP issue? Pt husband is calling stating that wife bp has been running all over the place. He said they went to hospital the other day because bp was low. He said she has not taken bp meds

## 2017-02-05 ENCOUNTER — Other Ambulatory Visit: Payer: Self-pay | Admitting: Physician Assistant

## 2017-02-05 DIAGNOSIS — G479 Sleep disorder, unspecified: Secondary | ICD-10-CM

## 2017-02-05 DIAGNOSIS — R062 Wheezing: Secondary | ICD-10-CM

## 2017-02-06 NOTE — Telephone Encounter (Signed)
Patient called back stating that her blood pressures have been fluctuating. She recently went to the ED, 7/9, for hypotension with blood pressure of 95/47.  Per the ED note: Cr 1.2, baseline around 0.7. I think mild AKI likely from lisinopril. UA showed 30 protein and no obvious UTI. CT head unremarkable. BP improved to upper 90s after 2 L NS bolus. Felt better. Ambulated to the bathroom with no problems. Will stop BP meds, repeat BMP with PCP next week, follow up with PCP next week for BP check. I think the visual hallucinations likely from the hypotension vs lack of sleep, doesn't need emergent psych eval currently.   The patient stated that yesterday her blood pressure was 150/90 and today is was 181/125. She recently missed her appointment at the HTN clinic on 7/24. She refused an appointment that was offered for today and tomorrow. It was stressed the importance of the patient coming in to be assessed especially with her blood pressures fluctuating so drastically. The patient stated that she was asymptomatic and would wait until Monday. An appointment was scheduled for Monday at 9:30 for the hypertension clinic. She has been advised to go back to the ED if she begins having adverse symptoms. She verbalized her understanding.

## 2017-02-06 NOTE — Telephone Encounter (Signed)
Left message to call back  

## 2017-02-06 NOTE — Telephone Encounter (Signed)
Returning your call from yesterday. °

## 2017-02-07 ENCOUNTER — Telehealth: Payer: Self-pay | Admitting: Cardiovascular Disease

## 2017-02-07 NOTE — Telephone Encounter (Signed)
New Message    Pt c/o BP issue: STAT if pt c/o blurred vision, one-sided weakness or slurred speech  1. What are your last 5 BP readings?  139/96 hr 45 146/100 hr 49  2. Are you having any other symptoms (ex. Dizziness, headache, blurred vision, passed out)?  Lethargic, cant keep eyes open , dizziness , disoreinted   3. What is your BP issue?  Heart rate low

## 2017-02-07 NOTE — Telephone Encounter (Signed)
Pt verbalizes ok to speak with Caroleen Hamman states that she would rather I speak with him), he states that BP is 139/96 hr 45 146/100 hr 49, he complains that pt is lethargic, dizzy ,cant keep eyes open, hands and feet are reddish/blue and disoriented. He states that this ha been happening for the last 20-30 min. Directed pt to go to [facility], [place] states that he will call PCP and see what his direction is. Verbalizes understanding of ER direction.

## 2017-02-10 ENCOUNTER — Ambulatory Visit (INDEPENDENT_AMBULATORY_CARE_PROVIDER_SITE_OTHER): Payer: BLUE CROSS/BLUE SHIELD | Admitting: Pharmacist

## 2017-02-10 VITALS — BP 126/80 | HR 84

## 2017-02-10 DIAGNOSIS — I10 Essential (primary) hypertension: Secondary | ICD-10-CM | POA: Diagnosis not present

## 2017-02-10 MED ORDER — HYDRALAZINE HCL 10 MG PO TABS: 10.0000 mg | ORAL_TABLET | Freq: Three times a day (TID) | ORAL | 1 refills | Status: DC | PRN

## 2017-02-10 NOTE — Assessment & Plan Note (Signed)
Blood pressure today is well controlled. Noted reports of multiple episodes of lethargy and decrease responsiveness at home not associated with hypotension or hypertension. Patient also has history of TBI, PTSD, and epilepsy. She is a 26 year old female who expressed interest of becoming pregnant once her pain management and blood pressure are better controlled. Recently patient experienced symptomatic hypotension while taking Lisinopril and hypertension with DBP > 100 while off BP medication. Patient is currently using Lisinopril 20 mg only as needed with most recent dose taken over 48 hours. Reported lethargy may be related to neurological disorders and I encouraged patient to follow up with her neurologist ASAP.  Will discontinue Lisinopril, initiate hydralazine 10mg  TID PRN for SBP >150 or DBP >100, continue BP monitoring twice daily and bring records to f/u visit in 3 weeks.  Plan to avoid ACEI or ARB due to teratogenic effects, and will need to avoid beta-blockers for now due to recent episodes of bradycardia for unknown reason. Methyldopa may be consider but may increase frequency of nightmare. Prazosin may be useful for BP management and PTSD nightmare but will not be appropriate once patient become pregnant.

## 2017-02-10 NOTE — Progress Notes (Signed)
Patient ID: Kristine Kelley                 DOB: Nov 16, 1990                      MRN: [id number]     HPI: Kristine Kelley is a 26 y.o. female referred by Dr. Gwenlyn Found to HTN clinic.  PMH includes PTSD, hx of epilepsy, pituitary tumor, migraines, neuropathy, chronic pain, asthma, and hypertension.  Currently undergoing assessment for obstructive sleep apnea. Noted chronic use of diclofenac 50mg  pack PRN  for migraines. During most recent office visit with cardiologist her lisinopril dose was increased from 10mg  daily to 20mg  daily but patient developed symptomatic hypotensive and had to visit Ed on 01/20/2017 due to BP 94/47, dizziness, increase lethargy and bradycardia.  Lisinopril was HELD during ED visit but her BP went up to 181/125 and patient experienced headaches.  Patient presents today to HTN clinic accompany by husband to assessment and medication titration.They reports multiple episodes of increase leathery and slow responsiveness non-related to hypotension or hypertension. Patient currently follow up with pain clinic but needs to establish with neurologist for epilepsy management assessment and therapy adjustment. She reports taking lisinopril 20mg  as needed only for the past week. NO doses of hypertension medication taken in last 2 days. During interview it was alos expressed [patient will like to become pregnant once her pain and blood pressure improves; therefore , ACE inhibitors and ARB are not a good 1st line therapy for this patient.  Current HTN meds:  Lisinopril 20mg  daily  Previously tried:  Lisinopril 10mg  daily  BP goal: 130/80  Social History: Denies tobacco use, alcohol or drug use  Diet: cooking mainly at home; trying to work on low sodium choices  Exercise: walking 30-60 minutes 2-3x/week  Home BP readings: 20 readings total July 3rd-July 9th; 6 readings; average 91/58 (ED visit on July/9th - BP medication stopped in ED) July 10th-July 29; 14 readings; average 136/92 (higherst  reading 181/125)   Wt Readings from Last 3 Encounters:  01/06/17 203 lb 12.8 oz (92.4 kg)  01/03/17 207 lb 9.6 oz (94.2 kg)  12/11/16 206 lb (93.4 kg)   BP Readings from Last 3 Encounters:  02/10/17 126/80  01/20/17 (!) 95/47  01/06/17 (!) 150/110   Pulse Readings from Last 3 Encounters:  02/10/17 84  01/20/17 75  01/06/17 94    Past Medical History:  Diagnosis Date  . Asthma   . Benign tumor of pituitary gland ([place])   . Chronic back pain   . Chronic pain   . PTSD (post-traumatic stress disorder)   . Seizures ([place])   . Traumatic brain injury [facility])     Current Outpatient Prescriptions on File Prior to Visit  Medication Sig Dispense Refill  . albuterol (PROVENTIL HFA;VENTOLIN HFA) 108 (90 Base) MCG/ACT inhaler Inhale 2 puffs into the lungs every 4 (four) hours as needed for wheezing or shortness of breath (cough, shortness of breath or wheezing.). 1 Inhaler 1  . desogestrel-ethinyl estradiol (APRI,EMOQUETTE,SOLIA) 0.15-30 MG-MCG tablet Take 1 tablet by mouth daily. 1 Package 11  . Diclofenac Potassium (CAMBIA) 50 MG PACK Take 50 mg by mouth daily as needed. 1 each 1  . DULoxetine (CYMBALTA) 60 MG capsule Take 60 mg by mouth daily.    Marland Kitchen lamoTRIgine (LAMICTAL) 150 MG tablet Take 150 mg by mouth daily.    . montelukast (SINGULAIR) 10 MG tablet Take 1 tablet (10 mg total) by mouth at bedtime.  30 tablet 3  . ondansetron (ZOFRAN ODT) 4 MG disintegrating tablet Take 1 tablet (4 mg total) by mouth every 8 (eight) hours as needed for nausea or vomiting. 40 tablet 0  . ondansetron (ZOFRAN) 4 MG tablet Take 4 mg by mouth every 8 (eight) hours as needed for nausea or vomiting.    Marland Kitchen oxyCODONE-acetaminophen (PERCOCET/ROXICET) 5-325 MG tablet Take by mouth every 8 (eight) hours as needed for severe pain.     Marland Kitchen tiZANidine (ZANAFLEX) 4 MG tablet Take 4 mg by mouth every 6 (six) hours as needed for muscle spasms.    Marland Kitchen EPINEPHrine 0.15 MG/0.15ML IJ injection Inject 0.15 mLs (0.15 mg total) into  the muscle as needed for anaphylaxis. 2 Device 0  . hydrOXYzine (VISTARIL) 50 MG capsule Take 1 capsule (50 mg total) by mouth 3 (three) times daily as needed (for sleep). Take 30-60 min before bed. May take 100mg  if needed (Patient not taking: Reported on 02/10/2017) 60 capsule 1  . methocarbamol (ROBAXIN) 500 MG tablet Take 1 tablet (500 mg total) by mouth 2 (two) times daily. (Patient not taking: Reported on 02/10/2017) 20 tablet 0   No current facility-administered medications on file prior to visit.     Allergies  Allergen Reactions  . Ambien [Zolpidem Tartrate] Other (See Comments)    PTSD, nightmares   . Dilantin [Phenytoin]     Intravenously - it burns   . Naproxen Hives    Blood pressure 126/80, pulse 84, last menstrual period 01/15/2017.  Essential hypertension Blood pressure today is well controlled. Noted reports of multiple episodes of lethargy and decrease responsiveness at home not associated with hypotension or hypertension. Patient also has history of TBI, PTSD, and epilepsy. She is a 26 year old female who expressed interest of becoming pregnant once her pain management and blood pressure are better controlled. Recently patient experienced symptomatic hypotension while taking Lisinopril and hypertension with DBP > 100 while off BP medication. Patient is currently using Lisinopril 20 mg only as needed with most recent dose taken over 48 hours. Reported lethargy may be related to neurological disorders and I encouraged patient to follow up with her neurologist ASAP.  Will discontinue Lisinopril, initiate hydralazine 10mg  TID PRN for SBP >150 or DBP >100, continue BP monitoring twice daily and bring records to f/u visit in 3 weeks.  Plan to avoid ACEI or ARB due to teratogenic effects, and will need to avoid beta-blockers for now due to recent episodes of bradycardia for unknown reason. Methyldopa may be consider but may increase frequency of nightmare. Prazosin may be useful for BP  management and PTSD nightmare but will not be appropriate once patient become pregnant.   Sorayah Schrodt Rodriguez-Guzman PharmD, [place] [id number] 02/10/2017 7:09 PM

## 2017-02-10 NOTE — Patient Instructions (Addendum)
Return for a a follow up appointment in 3 weeks  Your blood pressure today is 126/80 pulse 84   Check your blood pressure at home daily (if able) and keep record of the readings.  Take your BP meds as follows: STOP lisinopril START hydralazine 10mg  three times daily as needed for systolic BP >381 or diastolic BP > 829  Bring all of your meds, your BP cuff and your record of home blood pressures to your next appointment.  Exercise as you're able, try to walk approximately 30 minutes per day.  Keep salt intake to a minimum, especially watch canned and prepared boxed foods.  Eat more fresh fruits and vegetables and fewer canned items.  Avoid eating in fast food restaurants.    HOW TO TAKE YOUR BLOOD PRESSURE: . Rest 5 minutes before taking your blood pressure. .  Don't smoke or drink caffeinated beverages for at least 30 minutes before. . Take your blood pressure before (not after) you eat. . Sit comfortably with your back supported and both feet on the floor (don't cross your legs). . Elevate your arm to heart level on a table or a desk. . Use the proper sized cuff. It should fit smoothly and snugly around your bare upper arm. There should be enough room to slip a fingertip under the cuff. The bottom edge of the cuff should be 1 inch above the crease of the elbow. . Ideally, take 3 measurements at one sitting and record the average.

## 2017-02-19 ENCOUNTER — Encounter: Payer: Self-pay | Admitting: Physician Assistant

## 2017-02-24 ENCOUNTER — Telehealth: Payer: Self-pay | Admitting: Pharmacist Clinician (PhC)/ Clinical Pharmacy Specialist

## 2017-02-24 NOTE — Telephone Encounter (Signed)
LMOM for patient to check home BP again this evening and take another hydralazine if pressure still elevated.  Increase pressure could be due to pain, patient went to UC for ear pain according to husband.    Advised they keep appointment with Raquel set for Thursday and call before then with any further concerns.

## 2017-02-26 ENCOUNTER — Ambulatory Visit: Payer: BLUE CROSS/BLUE SHIELD | Admitting: Physician Assistant

## 2017-02-26 ENCOUNTER — Ambulatory Visit: Payer: BLUE CROSS/BLUE SHIELD | Admitting: Emergency Medicine

## 2017-02-27 ENCOUNTER — Ambulatory Visit: Payer: BLUE CROSS/BLUE SHIELD

## 2017-02-27 NOTE — Progress Notes (Deleted)
Patient ID: Kristine Kelley                 DOB: 1990/10/27                      MRN: [id number]     HPI: Kristine Kelley is a 26 y.o. female referred by Dr. Gwenlyn Kelley to HTN clinic.  PMH includes PTSD, hx of epilepsy, pituitary tumor, migraines, neuropathy, chronic pain, asthma, and hypertension.  Currently undergoing assessment for obstructive sleep apnea. Noted chronic use of diclofenac 50mg  pack PRN  for migraines. During most recent office visit with cardiologist her lisinopril dose was increased from 10mg  daily to 20mg  daily but patient developed symptomatic hypotensive and had to visit Ed on 01/20/2017 due to BP 94/47, dizziness, increase lethargy and bradycardia.  Lisinopril was disocntinued on 02/10/2017 due to hypotension and an average morning BP reading of 91/58. During interview patient  expressed desire to become pregnant once her pain and blood pressure improves; therefore , ACE inhibitors and ARB are not a good 1st line therapy for this patient.   Patient presents today to HTN clinic accompany by husband to assessment and medication titration.They reports multiple episodes of increase leathery and slow responsiveness non-related to hypotension or hypertension. Patient currently follows up with pain clinic,neurologist and nutritionist.  Current HTN meds:  Hydralazine 10mg  three times daily  BP goal: <130/80  Social History: Denies tobacco use, alcohol or drug use  Diet: cooking mainly at home; trying to work on low sodium choices  Exercise: walking 30-60 minutes 2-3x/week  Home BP readings:   Wt Readings from Last 3 Encounters:  01/06/17 203 lb 12.8 oz (92.4 kg)  01/03/17 207 lb 9.6 oz (94.2 kg)  12/11/16 206 lb (93.4 kg)   BP Readings from Last 3 Encounters:  02/10/17 126/80  01/20/17 (!) 95/47  01/06/17 (!) 150/110   Pulse Readings from Last 3 Encounters:  02/10/17 84  01/20/17 75  01/06/17 94    Past Medical History:  Diagnosis Date  . Asthma   . Benign tumor of  pituitary gland ([place])   . Chronic back pain   . Chronic pain   . PTSD (post-traumatic stress disorder)   . Seizures ([place])   . Traumatic brain injury [facility])     Current Outpatient Prescriptions on File Prior to Visit  Medication Sig Dispense Refill  . albuterol (PROVENTIL HFA;VENTOLIN HFA) 108 (90 Base) MCG/ACT inhaler Inhale 2 puffs into the lungs every 4 (four) hours as needed for wheezing or shortness of breath (cough, shortness of breath or wheezing.). 1 Inhaler 1  . desogestrel-ethinyl estradiol (APRI,EMOQUETTE,SOLIA) 0.15-30 MG-MCG tablet Take 1 tablet by mouth daily. 1 Package 11  . Diclofenac Potassium (CAMBIA) 50 MG PACK Take 50 mg by mouth daily as needed. 1 each 1  . diphenhydrAMINE (BENADRYL) 25 mg capsule Take 25 mg by mouth every 8 (eight) hours as needed.    . DULoxetine (CYMBALTA) 60 MG capsule Take 60 mg by mouth daily.    Marland Kitchen EPINEPHrine 0.15 MG/0.15ML IJ injection Inject 0.15 mLs (0.15 mg total) into the muscle as needed for anaphylaxis. 2 Device 0  . fentaNYL (DURAGESIC - DOSED MCG/HR) 12 MCG/HR Place 12.5 mcg onto the skin every 3 (three) days.    . hydrALAZINE (APRESOLINE) 10 MG tablet Take 1 tablet (10 mg total) by mouth 3 (three) times daily as needed. 90 tablet 1  . hydrOXYzine (VISTARIL) 50 MG capsule Take 1 capsule (50 mg total) by mouth  3 (three) times daily as needed (for sleep). Take 30-60 min before bed. May take 100mg  if needed (Patient not taking: Reported on 02/10/2017) 60 capsule 1  . ibuprofen (ADVIL,MOTRIN) 800 MG tablet Take 800 mg by mouth 2 (two) times daily as needed.    . lamoTRIgine (LAMICTAL) 150 MG tablet Take 150 mg by mouth daily.    . methocarbamol (ROBAXIN) 500 MG tablet Take 1 tablet (500 mg total) by mouth 2 (two) times daily. (Patient not taking: Reported on 02/10/2017) 20 tablet 0  . montelukast (SINGULAIR) 10 MG tablet Take 1 tablet (10 mg total) by mouth at bedtime. 30 tablet 3  . ondansetron (ZOFRAN ODT) 4 MG disintegrating tablet Take 1 tablet  (4 mg total) by mouth every 8 (eight) hours as needed for nausea or vomiting. 40 tablet 0  . ondansetron (ZOFRAN) 4 MG tablet Take 4 mg by mouth every 8 (eight) hours as needed for nausea or vomiting.    Marland Kitchen oxyCODONE-acetaminophen (PERCOCET/ROXICET) 5-325 MG tablet Take by mouth every 8 (eight) hours as needed for severe pain.     Marland Kitchen tiZANidine (ZANAFLEX) 4 MG tablet Take 4 mg by mouth every 6 (six) hours as needed for muscle spasms.     No current facility-administered medications on file prior to visit.     Allergies  Allergen Reactions  . Ambien [Zolpidem Tartrate] Other (See Comments)    PTSD, nightmares   . Dilantin [Phenytoin]     Intravenously - it burns   . Naproxen Hives    There were no vitals taken for this visit.  No problem-specific Assessment & Plan notes Kelley for this encounter.   Kristine Kelley PharmD, [place] [address] 02/27/2017 7:54 AM

## 2017-02-28 ENCOUNTER — Telehealth: Payer: Self-pay | Admitting: Cardiovascular Disease

## 2017-02-28 NOTE — Telephone Encounter (Signed)
Left vm msg on pt's cell and home#.  On 02-27-17@11 :35 a.m., [employer], [phone], per Sherolyn Buba, (pt's insurance) states her sleep stud, CPT [phone], is not a covered service, whether in or out of network.  Pt will need to be self pay for In Lab sleep study@[facility] on 03-04-17.

## 2017-03-04 ENCOUNTER — Other Ambulatory Visit: Payer: Self-pay | Admitting: Physician Assistant

## 2017-03-04 ENCOUNTER — Encounter (HOSPITAL_BASED_OUTPATIENT_CLINIC_OR_DEPARTMENT_OTHER): Payer: BLUE CROSS/BLUE SHIELD

## 2017-03-04 DIAGNOSIS — R110 Nausea: Secondary | ICD-10-CM

## 2017-03-05 ENCOUNTER — Ambulatory Visit (INDEPENDENT_AMBULATORY_CARE_PROVIDER_SITE_OTHER): Payer: BLUE CROSS/BLUE SHIELD | Admitting: Physician Assistant

## 2017-03-05 ENCOUNTER — Encounter: Payer: Self-pay | Admitting: Physician Assistant

## 2017-03-05 VITALS — BP 144/107 | HR 105 | Temp 98.0°F | Resp 16 | Ht 62.0 in | Wt 201.0 lb

## 2017-03-05 DIAGNOSIS — I10 Essential (primary) hypertension: Secondary | ICD-10-CM | POA: Diagnosis not present

## 2017-03-05 DIAGNOSIS — Z23 Encounter for immunization: Secondary | ICD-10-CM

## 2017-03-05 DIAGNOSIS — G43119 Migraine with aura, intractable, without status migrainosus: Secondary | ICD-10-CM

## 2017-03-05 DIAGNOSIS — J302 Other seasonal allergic rhinitis: Secondary | ICD-10-CM

## 2017-03-05 MED ORDER — CYCLOBENZAPRINE HCL 10 MG PO TABS: 10.0000 mg | ORAL_TABLET | Freq: Three times a day (TID) | ORAL | 0 refills | Status: DC | PRN

## 2017-03-05 MED ORDER — BUTALBITAL-APAP-CAFFEINE 50-325-40 MG PO TABS: 1.0000 | ORAL_TABLET | Freq: Four times a day (QID) | ORAL | 0 refills | Status: DC | PRN

## 2017-03-05 MED ORDER — RIZATRIPTAN BENZOATE 10 MG PO TABS: 10.0000 mg | ORAL_TABLET | ORAL | 1 refills | Status: DC | PRN

## 2017-03-05 NOTE — Patient Instructions (Addendum)
Fioricet is for acute migraine - this will make you very sleepy. Don't take this if you need to drive, work, etc . Flexeril - muscle relaxer. Your migraine may be due to tight neck muscles. Continue neck massage and heat/ice as needed.  Maxalt is for preventative treatment of your migraine. Take this as prescribed.  Trazadone - for sleep.  50 mg to 100 mg at bedtime. Do not take this with Fioricet, flexeril or maxalt.   Acupuncture: [place] or [place] (sliding pay scale) You will receive a phone call to schedule an appointment with allergy.   Thank you for coming in today. I hope you feel we met your needs.  Feel free to call PCP if you have any questions or further requests.  Please consider signing up for MyChart if you do not already have it, as this is a great way to communicate with me.  Best,  Whitney McVey, PA-C   IF you received an x-ray today, you will receive an invoice from [facility]. Please contact [facility] at [phone] with questions or concerns regarding your invoice.   IF you received labwork today, you will receive an invoice from [place]. Please contact [facility] at [phone] with questions or concerns regarding your invoice.   Our billing staff will not be able to assist you with questions regarding bills from these companies.  You will be contacted with the lab results as soon as they are available. The fastest way to get your results is to activate your My Chart account. Instructions are located on the last page of this paperwork. If you have not heard from [place] regarding the results in 2 weeks, please contact this office.

## 2017-03-05 NOTE — Progress Notes (Signed)
Kristine Kelley  MRN: [id number] DOB: 16-Jan-1991  PCP: Dorise Hiss, PA-C  Subjective:  Pt is a 26 year old female who presents to clinic for migraine x 3 days.She is here today with her husband.   She endorses dizziness and photophobia. Starts in back of her head and wraps over head toward front. Described as "throbbing". Neck muscles lock up. She has tried massage and heat. This is helping.   She has taken steroid packs   Sleep study is Apr 23, 2017   Review of Systems  Neurological: Positive for dizziness and headaches. Negative for syncope, weakness and light-headedness.  Psychiatric/Behavioral: Negative for confusion and decreased concentration.    Patient Active Problem List   Diagnosis Date Noted  . Obstructive sleep apnea 01/03/2017  . Essential hypertension 12/11/2016  . Asthma 11/12/2012  . DDD (degenerative disc disease), cervical 11/12/2012  . Epilepsy ([place]) 11/12/2012  . Displacement of lumbar intervertebral disc without myelopathy 11/12/2012  . Migraine with aura 11/12/2012  . 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 Obesity 11/12/2012  . Other chronic pain 11/12/2012  . Tachycardia 11/12/2012  . Kidney stone 11/10/2012  . Posttraumatic stress disorder 11/10/2012  . Seizures ([place]) 07/24/2011    Current Outpatient Prescriptions on File Prior to Visit  Medication Sig Dispense Refill  . albuterol (PROVENTIL HFA;VENTOLIN HFA) 108 (90 Base) MCG/ACT inhaler Inhale 2 puffs into the lungs every 4 (four) hours as needed for wheezing or shortness of breath (cough, shortness of breath or wheezing.). 1 Inhaler 1  . desogestrel-ethinyl estradiol (APRI,EMOQUETTE,SOLIA) 0.15-30 MG-MCG tablet Take 1 tablet by mouth daily. 1 Package 11  . diphenhydrAMINE (BENADRYL) 25 mg capsule Take 25 mg by mouth every 8 (eight) hours as needed.    . DULoxetine (CYMBALTA) 60 MG capsule Take 60 mg by mouth daily.    Marland Kitchen  EPINEPHrine 0.15 MG/0.15ML IJ injection Inject 0.15 mLs (0.15 mg total) into the muscle as needed for anaphylaxis. 2 Device 0  . fentaNYL (DURAGESIC - DOSED MCG/HR) 12 MCG/HR Place 12.5 mcg onto the skin every 3 (three) days.    . hydrALAZINE (APRESOLINE) 10 MG tablet Take 1 tablet (10 mg total) by mouth 3 (three) times daily as needed. 90 tablet 1  . ibuprofen (ADVIL,MOTRIN) 800 MG tablet Take 800 mg by mouth 2 (two) times daily as needed.    . lamoTRIgine (LAMICTAL) 150 MG tablet Take 150 mg by mouth daily.    . montelukast (SINGULAIR) 10 MG tablet Take 1 tablet (10 mg total) by mouth at bedtime. 30 tablet 3  . ondansetron (ZOFRAN ODT) 4 MG disintegrating tablet Take 1 tablet (4 mg total) by mouth every 8 (eight) hours as needed for nausea or vomiting. 40 tablet 0  . ondansetron (ZOFRAN) 4 MG tablet Take 4 mg by mouth every 8 (eight) hours as needed for nausea or vomiting.    Marland Kitchen oxyCODONE-acetaminophen (PERCOCET/ROXICET) 5-325 MG tablet Take by mouth every 8 (eight) hours as needed for severe pain.     Marland Kitchen tiZANidine (ZANAFLEX) 4 MG tablet Take 4 mg by mouth every 6 (six) hours as needed for muscle spasms.    . Diclofenac Potassium (CAMBIA) 50 MG PACK Take 50 mg by mouth daily as needed. (Patient not taking: Reported on 03/05/2017) 1 each 1  . hydrOXYzine (VISTARIL) 50 MG capsule Take 1 capsule (50 mg total) by mouth 3 (three) times daily as needed (for sleep). Take 30-60 min before bed. May  take 100mg  if needed (Patient not taking: Reported on 02/10/2017) 60 capsule 1  . methocarbamol (ROBAXIN) 500 MG tablet Take 1 tablet (500 mg total) by mouth 2 (two) times daily. (Patient not taking: Reported on 02/10/2017) 20 tablet 0   No current facility-administered medications on file prior to visit.     Allergies  Allergen Reactions  . Ambien [Zolpidem Tartrate] Other (See Comments)    PTSD, nightmares   . Dilantin [Phenytoin]     Intravenously - it burns   . Naproxen Hives     Objective:  BP (!)  144/107   Pulse (!) 105   Temp 98 F (36.7 C) (Oral)   Resp 16   Ht 5\' 2"  (1.575 m)   Wt 201 lb (91.2 kg)   LMP 02/26/2017 (Approximate)   SpO2 97%   BMI 36.76 kg/m   Physical Exam  Constitutional: She is oriented to person, place, and time and well-developed, well-nourished, and in no distress. No distress.  Eyes: Pupils are equal, round, and reactive to light. EOM are normal.  Cardiovascular: Normal rate, regular rhythm and normal heart sounds.   Neurological: She is alert and oriented to person, place, and time. GCS score is 15.  Skin: Skin is warm and dry.  Psychiatric: Mood, memory, affect and judgment normal.  Vitals reviewed.   Assessment and Plan :  1. Intractable migraine with aura without status migrainosus - butalbital-acetaminophen-caffeine (FIORICET, ESGIC) 50-325-40 MG tablet; Take 1-2 tablets by mouth every 6 (six) hours as needed for headache.  Dispense: 20 tablet; Refill: 0 - cyclobenzaprine (FLEXERIL) 10 MG tablet; Take 1 tablet (10 mg total) by mouth 3 (three) times daily as needed for muscle spasms.  Dispense: 30 tablet; Refill: 0 - rizatriptan (MAXALT) 10 MG tablet; Take 1 tablet (10 mg total) by mouth as needed for migraine. May repeat in 2 hours if needed. Max dose 30mg /day  Dispense: 30 tablet; Refill: 1  2. Essential hypertension - Sleep study Apr 23, 2017.  If negative will consider renal ultrasound.   3. Seasonal allergic rhinitis, unspecified trigger - Ambulatory referral to Allergy  4. Need for influenza vaccination - Flu Vaccine QUAD 6+ mos PF IM (Fluarix Quad PF)   Mercer Pod, PA-C  [facility] [place] 03/05/2017 11:24 AM

## 2017-03-13 ENCOUNTER — Encounter: Payer: Self-pay | Admitting: Physician Assistant

## 2017-03-13 ENCOUNTER — Encounter: Payer: Self-pay | Admitting: Neurology

## 2017-03-13 ENCOUNTER — Encounter: Payer: Self-pay | Admitting: Physical Medicine & Rehabilitation

## 2017-03-18 ENCOUNTER — Ambulatory Visit: Payer: BLUE CROSS/BLUE SHIELD | Attending: Physician Assistant | Admitting: Physical Therapy

## 2017-03-19 ENCOUNTER — Ambulatory Visit: Payer: BLUE CROSS/BLUE SHIELD | Admitting: Allergy and Immunology

## 2017-03-24 ENCOUNTER — Telehealth: Payer: Self-pay | Admitting: Pharmacist

## 2017-03-24 NOTE — Telephone Encounter (Signed)
Patient's husband called the office on Saturday and left message. Kristine Kelley's blood pressure was 78/52 on Saturday and she had trouble getting out of bed.    No additional problems noted during the weekend and BP check today showed elevated BP.  No changes in medication recommended today. BP clinic follow up scheduled for 03/27/2017

## 2017-03-26 ENCOUNTER — Ambulatory Visit: Payer: BLUE CROSS/BLUE SHIELD | Admitting: Registered"

## 2017-03-26 ENCOUNTER — Encounter: Payer: Self-pay | Admitting: Physician Assistant

## 2017-03-27 ENCOUNTER — Ambulatory Visit (INDEPENDENT_AMBULATORY_CARE_PROVIDER_SITE_OTHER): Payer: BLUE CROSS/BLUE SHIELD | Admitting: Pharmacist

## 2017-03-27 VITALS — BP 152/110 | HR 102

## 2017-03-27 DIAGNOSIS — I10 Essential (primary) hypertension: Secondary | ICD-10-CM

## 2017-03-27 MED ORDER — LISINOPRIL 20 MG PO TABS: 10.0000 mg | ORAL_TABLET | Freq: Every day | ORAL | 0 refills | Status: DC

## 2017-03-27 MED ORDER — HYDRALAZINE HCL 10 MG PO TABS: 20.0000 mg | ORAL_TABLET | Freq: Three times a day (TID) | ORAL | 0 refills | Status: DC | PRN

## 2017-03-27 NOTE — Patient Instructions (Addendum)
Return for a a follow up appointment in 3 weeks  Your blood pressure today is 152/110 pulse 102  Check your blood pressure at home daily (if able) and keep record of the readings.  Take your BP meds as follows: *START taking lisinopril 10mg  every morning if BP above 100/70 *CONTINUE taking hydralazine 20mg  up to three times daily if systolic BP above 453 or diastolic BP above 646  Measure BP two to three time daily and bring records to next hypertension appointment  Bring all of your meds, your BP cuff and your record of home blood pressures to your next appointment.  Exercise as you're able, try to walk approximately 30 minutes per day.  Keep salt intake to a minimum, especially watch canned and prepared boxed foods.  Eat more fresh fruits and vegetables and fewer canned items.  Avoid eating in fast food restaurants.    HOW TO TAKE YOUR BLOOD PRESSURE: . Rest 5 minutes before taking your blood pressure. .  Don't smoke or drink caffeinated beverages for at least 30 minutes before. . Take your blood pressure before (not after) you eat. . Sit comfortably with your back supported and both feet on the floor (don't cross your legs). . Elevate your arm to heart level on a table or a desk. . Use the proper sized cuff. It should fit smoothly and snugly around your bare upper arm. There should be enough room to slip a fingertip under the cuff. The bottom edge of the cuff should be 1 inch above the crease of the elbow. . Ideally, take 3 measurements at one sitting and record the average.

## 2017-03-27 NOTE — Progress Notes (Signed)
Patient ID: Kristine Kelley                 DOB: October 26, 1990                      MRN: [id number]    PMH includes PTSD, hx of epilepsy, pituitary tumor, migraines, neuropathy, chronic pain, asthma, and hypertension.  Currently undergoing assessment for obstructive sleep apnea and scheduled follow up appointment with neurologist in town. Her PCP is also adjusting migraine medication. Diclofenac 50mg  pack PRN for migraines was recently discontinued by PCP. During most recent office visit we held her lisinopril and started hydralazine 10mg  TID PRN due to multiple episodes of symptomatic hypotension. Patient missed her f/u appointment in August but placed a call last week due to another episode of symptomatic hypotension.   Patient presents today to HTN clinic accompany by husband .They report only 1 episode of increased lethargy and fatigue related to hypotension. Also reports taking hydralazine 10mg  most days of the month. Patient is young and in reproductive age; will like to avoid chronic use of ARBs and ACEi but is current BP extremely elevated. Patient and husband expressed they are using prophylaxis for pregnancy until pain, migraines and blood pressure are better controlled. HPI: Kristine Kelley is a 26 y.o. female referred by Dr. Gwenlyn Found to HTN clinic.   Current HTN meds:  Hydralazine 10mg  TID PRN for SBP >150 or DBP >100  Previously tried:  Lisinopril - on HOLD  BP goal: 130/80  Social History: Denies tobacco use, alcohol or drug use  Diet: cooking mainly at home; trying to work on low sodium choices  Exercise: walking 30-60 minutes 2-3x/week  Home BP readings:  28 readings in August; average 148/114 (pulse 62 - 131 bpm) 4 readings in September; average 120/84 (pulse 62-102 bpm) - 1 episode of symptomatic hypotension  Wt Readings from Last 3 Encounters:  03/05/17 201 lb (91.2 kg)  01/06/17 203 lb 12.8 oz (92.4 kg)  01/03/17 207 lb 9.6 oz (94.2 kg)   BP Readings from Last 3  Encounters:  03/27/17 (!) 152/110  03/05/17 (!) 144/107  02/10/17 126/80   Pulse Readings from Last 3 Encounters:  03/27/17 (!) 102  03/05/17 (!) 105  02/10/17 84    Past Medical History:  Diagnosis Date  . Asthma   . Benign tumor of pituitary gland ([place])   . Chronic back pain   . Chronic pain   . PTSD (post-traumatic stress disorder)   . Seizures ([place])   . Traumatic brain injury [facility])     Current Outpatient Prescriptions on File Prior to Visit  Medication Sig Dispense Refill  . albuterol (PROVENTIL HFA;VENTOLIN HFA) 108 (90 Base) MCG/ACT inhaler Inhale 2 puffs into the lungs every 4 (four) hours as needed for wheezing or shortness of breath (cough, shortness of breath or wheezing.). 1 Inhaler 1  . butalbital-acetaminophen-caffeine (FIORICET, ESGIC) 50-325-40 MG tablet Take 1-2 tablets by mouth every 6 (six) hours as needed for headache. 20 tablet 0  . cyclobenzaprine (FLEXERIL) 10 MG tablet Take 1 tablet (10 mg total) by mouth 3 (three) times daily as needed for muscle spasms. 30 tablet 0  . desogestrel-ethinyl estradiol (APRI,EMOQUETTE,SOLIA) 0.15-30 MG-MCG tablet Take 1 tablet by mouth daily. 1 Package 11  . diphenhydrAMINE (BENADRYL) 25 mg capsule Take 25 mg by mouth every 8 (eight) hours as needed.    . DULoxetine (CYMBALTA) 60 MG capsule Take 60 mg by mouth daily.    Marland Kitchen EPINEPHrine 0.15  MG/0.15ML IJ injection Inject 0.15 mLs (0.15 mg total) into the muscle as needed for anaphylaxis. 2 Device 0  . fentaNYL (DURAGESIC - DOSED MCG/HR) 12 MCG/HR Place 12.5 mcg onto the skin every 3 (three) days.    Marland Kitchen ibuprofen (ADVIL,MOTRIN) 800 MG tablet Take 800 mg by mouth 2 (two) times daily as needed.    . lamoTRIgine (LAMICTAL) 150 MG tablet Take 150 mg by mouth daily.    . montelukast (SINGULAIR) 10 MG tablet Take 1 tablet (10 mg total) by mouth at bedtime. 30 tablet 3  . ondansetron (ZOFRAN) 4 MG tablet Take 4 mg by mouth every 8 (eight) hours as needed for nausea or vomiting.    .  ondansetron (ZOFRAN-ODT) 4 MG disintegrating tablet DISSOLVE 1 tablet by MOUTH every EIGHT hours as needed for nausea or vomiting. 40 tablet 0  . oxyCODONE-acetaminophen (PERCOCET/ROXICET) 5-325 MG tablet Take by mouth every 8 (eight) hours as needed for severe pain.     . rizatriptan (MAXALT) 10 MG tablet Take 1 tablet (10 mg total) by mouth as needed for migraine. May repeat in 2 hours if needed. Max dose 30mg /day 30 tablet 1  . tiZANidine (ZANAFLEX) 4 MG tablet Take 4 mg by mouth every 6 (six) hours as needed for muscle spasms.     No current facility-administered medications on file prior to visit.     Allergies  Allergen Reactions  . Ambien [Zolpidem Tartrate] Other (See Comments)    PTSD, nightmares   . Dilantin [Phenytoin]     Intravenously - it burns   . Naproxen Hives    Blood pressure (!) 152/110, pulse (!) 102, last menstrual period 02/26/2017.  Essential hypertension Blood pressure is significantly elevated and Hydralazine 10mg  is not providing enough BP control. Will resume lisinopril at 10mg  daily and change hydralazine to 20mg  PRN for BP >150 or DBP>100. Emphasis placed to pregnancy prophylaxis during therapy with lisinopril and contact clinic if pregnancy suspected. Occasional hypotensive episodes may be related to neurological changes and pain management medication.  Need twice daily BP monitoring to bring documentation to next f/u visit in 4 weeks. Plan to add beta-blocker to therapy if additional BP control needed and HR remains elevated.  Toini Failla Rodriguez-Guzman PharmD, BCPS, [place] [address] 03/28/2017 8:33 AM

## 2017-03-28 NOTE — Assessment & Plan Note (Addendum)
Blood pressure is significantly elevated and Hydralazine 10mg  is not providing enough BP control. Will resume lisinopril at 10mg  daily and change hydralazine to 20mg  PRN for BP >150 or DBP>100. Emphasis placed to pregnancy prophylaxis during therapy with lisinopril and contact clinic if pregnancy suspected. Occasional hypotensive episodes may be related to neurological changes and pain management medication.  Need twice daily BP monitoring to bring documentation to next f/u visit in 4 weeks. Plan to add beta-blocker to therapy if additional BP control needed and HR remains elevated.

## 2017-04-01 ENCOUNTER — Telehealth: Payer: Self-pay | Admitting: Family Medicine

## 2017-04-01 NOTE — Telephone Encounter (Signed)
See message below  Please advise

## 2017-04-04 ENCOUNTER — Encounter: Payer: Self-pay | Admitting: Physician Assistant

## 2017-04-04 NOTE — Telephone Encounter (Signed)
PT CALLING BACK ABOUT MEDICINES

## 2017-04-08 ENCOUNTER — Other Ambulatory Visit: Payer: Self-pay | Admitting: Physician Assistant

## 2017-04-08 ENCOUNTER — Telehealth: Payer: Self-pay | Admitting: *Deleted

## 2017-04-08 DIAGNOSIS — G479 Sleep disorder, unspecified: Secondary | ICD-10-CM

## 2017-04-08 MED ORDER — TRAZODONE HCL 100 MG PO TABS: 100.0000 mg | ORAL_TABLET | Freq: Every evening | ORAL | 1 refills | Status: DC | PRN

## 2017-04-08 NOTE — Telephone Encounter (Signed)
Provider has already reached out to patient on MyChart

## 2017-04-08 NOTE — Telephone Encounter (Signed)
LMOM for pt to call the ofc

## 2017-04-17 ENCOUNTER — Telehealth: Payer: Self-pay | Admitting: Internal Medicine

## 2017-04-17 ENCOUNTER — Encounter: Payer: Self-pay | Admitting: Pharmacist

## 2017-04-17 ENCOUNTER — Ambulatory Visit: Payer: BLUE CROSS/BLUE SHIELD

## 2017-04-17 ENCOUNTER — Ambulatory Visit (INDEPENDENT_AMBULATORY_CARE_PROVIDER_SITE_OTHER): Payer: BLUE CROSS/BLUE SHIELD | Admitting: Pharmacist

## 2017-04-17 VITALS — BP 118/74 | HR 88

## 2017-04-17 DIAGNOSIS — I10 Essential (primary) hypertension: Secondary | ICD-10-CM

## 2017-04-17 MED ORDER — LABETALOL HCL 100 MG PO TABS: 100.0000 mg | ORAL_TABLET | Freq: Two times a day (BID) | ORAL | 0 refills | Status: DC

## 2017-04-17 MED ORDER — METOPROLOL TARTRATE 25 MG PO TABS: 25.0000 mg | ORAL_TABLET | Freq: Two times a day (BID) | ORAL | 1 refills | Status: DC

## 2017-04-17 MED ORDER — MIDODRINE HCL 2.5 MG PO TABS: 2.5000 mg | ORAL_TABLET | Freq: Three times a day (TID) | ORAL | 0 refills | Status: DC

## 2017-04-17 NOTE — Progress Notes (Signed)
Patient ID: Kristine Kelley                 DOB: 1990/12/22                      MRN: [id number]       HPI: Kristine Kelley is a 26 y.o. female referred by Dr. Gwenlyn Found to HTN clinic. PMH includes PTSD, hx of epilepsy, pituitary tumor, migraines, neuropathy, chronic pain, asthma, and hypertension.  Currently undergoing assessment for obstructive sleep apnea and scheduled to follow up with neurologist in town. Her PCP is also adjusting migraine medication. Noted history of symptomatic hypotensive episodes with BP at 69/51 and 63/43. BP remains elevated most of the time with diastolic BP >10 and pulse up to 110 bpm. During most recent episode of hypotension, PCP reccommended visit to ED but patient refused due to cost and inability to pay for care. She used lisinopril only 8 times in the past 2 week as morning BP was above 130/90 in the morning and 9 doses of hydralazine in the last 2 weeks for evening readings >150/100.  Periods of hypotension are NOT associated with postprandial measurements or post-hydralazine dose intake.   Patient repost improvement on migraine headaches. Denies chest pain , shortness of breath , swelling or any other problems. We discussed the possibility of opioid induced hypotension and oversedation. She is to monitor BP, RR and heart rate daily. Symptoms associated with opiod overdose and need to keep naloxone available also discussed during the appointment.     Current HTN meds:  Lisinopril 10mg  daily Hydralazine 20mg  TID PRN for SBP >150 or DBP >100  BP goal: 130/80  Social History: Denies tobacco use, alcohol or drug use  Diet: cooking mainly at home; trying to work on low sodium choices  Exercise: walking 30-60 minutes 2-3x/week  Home BP readings:  24 readings: average 131/90; pulse 57-119 bpm  Wt Readings from Last 3 Encounters:  03/05/17 201 lb (91.2 kg)  01/06/17 203 lb 12.8 oz (92.4 kg)  01/03/17 207 lb 9.6 oz (94.2 kg)   BP Readings from Last 3 Encounters:    04/17/17 118/74  03/27/17 (!) 152/110  03/05/17 (!) 144/107   Pulse Readings from Last 3 Encounters:  04/17/17 88  03/27/17 (!) 102  03/05/17 (!) 105    Past Medical History:  Diagnosis Date  . Asthma   . Benign tumor of pituitary gland ([place])   . Chronic back pain   . Chronic pain   . PTSD (post-traumatic stress disorder)   . Seizures ([place])   . Traumatic brain injury [facility])     Current Outpatient Prescriptions on File Prior to Visit  Medication Sig Dispense Refill  . albuterol (PROVENTIL HFA;VENTOLIN HFA) 108 (90 Base) MCG/ACT inhaler Inhale 2 puffs into the lungs every 4 (four) hours as needed for wheezing or shortness of breath (cough, shortness of breath or wheezing.). 1 Inhaler 1  . butalbital-acetaminophen-caffeine (FIORICET, ESGIC) 50-325-40 MG tablet Take 1-2 tablets by mouth every 6 (six) hours as needed for headache. 20 tablet 0  . cyclobenzaprine (FLEXERIL) 10 MG tablet Take 1 tablet (10 mg total) by mouth 3 (three) times daily as needed for muscle spasms. 30 tablet 0  . desogestrel-ethinyl estradiol (APRI,EMOQUETTE,SOLIA) 0.15-30 MG-MCG tablet Take 1 tablet by mouth daily. 1 Package 11  . diphenhydrAMINE (BENADRYL) 25 mg capsule Take 25 mg by mouth every 8 (eight) hours as needed.    Marland Kitchen EPINEPHrine 0.15 MG/0.15ML IJ injection Inject 0.15  mLs (0.15 mg total) into the muscle as needed for anaphylaxis. 2 Device 0  . fentaNYL (DURAGESIC - DOSED MCG/HR) 12 MCG/HR Place 12.5 mcg onto the skin every 3 (three) days.    . hydrALAZINE (APRESOLINE) 10 MG tablet Take 2 tablets (20 mg total) by mouth 3 (three) times daily as needed. 180 tablet 0  . ibuprofen (ADVIL,MOTRIN) 800 MG tablet Take 800 mg by mouth 2 (two) times daily as needed.    . lamoTRIgine (LAMICTAL) 150 MG tablet Take 150 mg by mouth daily.    . montelukast (SINGULAIR) 10 MG tablet Take 1 tablet (10 mg total) by mouth at bedtime. 30 tablet 3  . ondansetron (ZOFRAN) 4 MG tablet Take 4 mg by mouth every 8 (eight) hours  as needed for nausea or vomiting.    . ondansetron (ZOFRAN-ODT) 4 MG disintegrating tablet DISSOLVE 1 tablet by MOUTH every EIGHT hours as needed for nausea or vomiting. 40 tablet 0  . oxyCODONE-acetaminophen (PERCOCET/ROXICET) 5-325 MG tablet Take by mouth every 8 (eight) hours as needed for severe pain.     . rizatriptan (MAXALT) 10 MG tablet Take 1 tablet (10 mg total) by mouth as needed for migraine. May repeat in 2 hours if needed. Max dose 30mg /day 30 tablet 1  . tiZANidine (ZANAFLEX) 4 MG tablet Take 4 mg by mouth every 6 (six) hours as needed for muscle spasms.    . traZODone (DESYREL) 100 MG tablet Take 1 tablet (100 mg total) by mouth at bedtime as needed for sleep. 60 tablet 1   No current facility-administered medications on file prior to visit.     Allergies  Allergen Reactions  . Ambien [Zolpidem Tartrate] Other (See Comments)    PTSD, nightmares   . Dilantin [Phenytoin]     Intravenously - it burns   . Naproxen Hives    Blood pressure 118/74, pulse 88.  Essential hypertension Blood pressure is well controlled during office visit today at 118/74, but patient reports recent symptomatic hypotensive episodes. Patient needs to complete neurological assessment as soon as possible due to chronic use of opioids and seizure medication that may be causing periods of hypotension and/or tachycardia. Precautions needed with fentanyl patch usage like avoiding hot bath wes also discussed. Patient used Lisinopril only 8 times in 2 weeks with and average BP home reading of 131/90 and pulse fluctuating between 57-110 bpm; therefore, will discontinue Lisinopril, avoid BP agents with alpha blocker activity, and initiate selective beta-blocker agent to control BP and HR.  Will initiate midodrine 2.5mg  TID as needed for hypotensive episodes and adjust therapy as needed to appropriate response. Plan to initiate Northera if patient fails midodrine therapy. Patient also instructed to continue twice  daily blood pressure monitoring at home to bring records to next F/U appointment in 2 weeks.  Brenleigh Collet Rodriguez-Guzman PharmD, BCPS, [place] [id number] 04/23/2017 8:33 PM

## 2017-04-17 NOTE — Patient Instructions (Addendum)
Return for a  follow up appointment in 3 weeks  Your blood pressure today is 118/74 pulse 88  Check your blood pressure at home daily (if able) and keep record of the readings.  Take your BP meds as follows: **STOP taking lisinopril ** **START taking metoprolol 25mg  twice daily* - Check blood pressure before taking daily dose** **Keep taking Hydralazine as needed ONLY if blood pressure remains above 160/110**  Bring all of your meds, your BP cuff and your record of home blood pressures to your next appointment.  Exercise as you're able, try to walk approximately 30 minutes per day.  Keep salt intake to a minimum, especially watch canned and prepared boxed foods.  Eat more fresh fruits and vegetables and fewer canned items.  Avoid eating in fast food restaurants.    HOW TO TAKE YOUR BLOOD PRESSURE: . Rest 5 minutes before taking your blood pressure. .  Don't smoke or drink caffeinated beverages for at least 30 minutes before. . Take your blood pressure before (not after) you eat. . Sit comfortably with your back supported and both feet on the floor (don't cross your legs). . Elevate your arm to heart level on a table or a desk. . Use the proper sized cuff. It should fit smoothly and snugly around your bare upper arm. There should be enough room to slip a fingertip under the cuff. The bottom edge of the cuff should be 1 inch above the crease of the elbow. . Ideally, take 3 measurements at one sitting and record the average.

## 2017-04-17 NOTE — Telephone Encounter (Signed)
Patient's husband called that patient is having bp of 60s at home. She took her lisinopril in the morning. She is also feeling pins and needles in the body and is very sleepy. While on phone I asked him to take bp again. At this time the sbp was 109. I think she is having a migraine with aura however due to low bp and possible dehydration, I asked them to go to nearest ED to get evaluated.

## 2017-04-18 ENCOUNTER — Other Ambulatory Visit: Payer: Self-pay | Admitting: Physician Assistant

## 2017-04-18 ENCOUNTER — Telehealth: Payer: Self-pay | Admitting: *Deleted

## 2017-04-18 DIAGNOSIS — F4310 Post-traumatic stress disorder, unspecified: Secondary | ICD-10-CM

## 2017-04-18 DIAGNOSIS — G8929 Other chronic pain: Secondary | ICD-10-CM

## 2017-04-18 MED ORDER — DULOXETINE HCL 60 MG PO CPEP: 60.0000 mg | ORAL_CAPSULE | Freq: Every day | ORAL | 3 refills | Status: DC

## 2017-04-18 MED ORDER — DULOXETINE HCL 30 MG PO CPEP: 30.0000 mg | ORAL_CAPSULE | Freq: Every day | ORAL | 3 refills | Status: DC

## 2017-04-18 NOTE — Telephone Encounter (Signed)
Called pt with 3 attempts to contact her. LMOM to call the office as of today, still haven't heard back from her.

## 2017-04-23 ENCOUNTER — Encounter (HOSPITAL_BASED_OUTPATIENT_CLINIC_OR_DEPARTMENT_OTHER): Payer: BLUE CROSS/BLUE SHIELD

## 2017-04-23 NOTE — Assessment & Plan Note (Addendum)
Blood pressure is well controlled during office visit today at 118/74, but patient reports recent symptomatic hypotensive episodes. Patient needs to complete neurological assessment as soon as possible due to chronic use of opioids and seizure medication that may be causing periods of hypotension and/or tachycardia. Precautions needed with fentanyl patch usage like avoiding hot bath wes also discussed. Patient used Lisinopril only 8 times in 2 weeks with and average BP home reading of 131/90 and pulse fluctuating between 57-110 bpm; therefore, will discontinue Lisinopril, avoid BP agents with alpha blocker activity, and initiate selective beta-blocker agent to control BP and HR.  Will initiate midodrine 2.5mg  TID as needed for hypotensive episodes and adjust therapy as needed to appropriate response. Plan to initiate Northera if patient fails midodrine therapy. Patient also instructed to continue twice daily blood pressure monitoring at home to bring records to next F/U appointment in 2 weeks.

## 2017-04-24 ENCOUNTER — Encounter: Payer: Self-pay | Admitting: Pharmacist

## 2017-04-30 ENCOUNTER — Encounter: Payer: BLUE CROSS/BLUE SHIELD | Admitting: Physical Medicine & Rehabilitation

## 2017-05-03 ENCOUNTER — Other Ambulatory Visit: Payer: Self-pay | Admitting: Physician Assistant

## 2017-05-03 DIAGNOSIS — G43119 Migraine with aura, intractable, without status migrainosus: Secondary | ICD-10-CM

## 2017-05-06 ENCOUNTER — Ambulatory Visit: Payer: Self-pay

## 2017-05-06 NOTE — Progress Notes (Deleted)
Patient ID: Kristine Kelley                 DOB: 1991-01-20                      MRN: [id number]       HPI: Kristine Kelley is a 26 y.o. female referred by Dr. Gwenlyn Found to HTN clinic. PMH includes PTSD, hx of epilepsy, pituitary tumor, migraines, neuropathy, chronic pain, asthma, and hypertension.  Currently undergoing assessment for obstructive sleep apnea and scheduled to follow up with neurologist in town. Her PCP is also adjusting migraine medication. Noted history of symptomatic hypotensive episodes with BP at 69/51 and 63/43. BP remains elevated most of the time with diastolic BP >54 and pulse up to 110 bpm. During most recent episode of hypotension, PCP reccommended visit to ED but patient refused due to cost and inability to pay for care. She used lisinopril only 8 times in the past 2 week as morning BP was above 130/90 in the morning and 9 doses of hydralazine in the last 2 weeks for evening readings >150/100.  Periods of hypotension are NOT associated with postprandial measurements or post-hydralazine dose intake.   Patient repost improvement on migraine headaches. Denies chest pain , shortness of breath , swelling or any other problems. We discussed the possibility of opioid induced hypotension and oversedation. She is to monitor BP, RR and heart rate daily. Symptoms associated with opiod overdose and need to keep naloxone available also discussed during the appointment.     Current HTN meds:  Metoprolol 25 mg bid Hydralazine 20mg  TID PRN for SBP >150 or DBP >100 Midodrine 2.5 mg TID PRN hypotensive episodes  BP goal: 130/80  Social History: Denies tobacco use, alcohol or drug use  Diet: cooking mainly at home; trying to work on low sodium choices  Exercise: walking 30-60 minutes 2-3x/week  Home BP readings:  24 readings: average 131/90; pulse 57-119 bpm  Wt Readings from Last 3 Encounters:  03/05/17 201 lb (91.2 kg)  01/06/17 203 lb 12.8 oz (92.4 kg)  01/03/17 207 lb 9.6 oz  (94.2 kg)   BP Readings from Last 3 Encounters:  04/17/17 118/74  03/27/17 (!) 152/110  03/05/17 (!) 144/107   Pulse Readings from Last 3 Encounters:  04/17/17 88  03/27/17 (!) 102  03/05/17 (!) 105    Past Medical History:  Diagnosis Date  . Asthma   . Benign tumor of pituitary gland ([place])   . Chronic back pain   . Chronic pain   . PTSD (post-traumatic stress disorder)   . Seizures ([place])   . Traumatic brain injury [facility])     Current Outpatient Prescriptions on File Prior to Visit  Medication Sig Dispense Refill  . albuterol (PROVENTIL HFA;VENTOLIN HFA) 108 (90 Base) MCG/ACT inhaler Inhale 2 puffs into the lungs every 4 (four) hours as needed for wheezing or shortness of breath (cough, shortness of breath or wheezing.). 1 Inhaler 1  . butalbital-acetaminophen-caffeine (FIORICET, ESGIC) 50-325-40 MG tablet Take 1-2 tablets by mouth every 6 (six) hours as needed for headache. 20 tablet 0  . cyclobenzaprine (FLEXERIL) 10 MG tablet Take 1 tablet (10 mg total) by mouth 3 (three) times daily as needed for muscle spasms. 30 tablet 0  . desogestrel-ethinyl estradiol (APRI,EMOQUETTE,SOLIA) 0.15-30 MG-MCG tablet Take 1 tablet by mouth daily. 1 Package 11  . diphenhydrAMINE (BENADRYL) 25 mg capsule Take 25 mg by mouth every 8 (eight) hours as needed.    Marland Kitchen  DULoxetine (CYMBALTA) 30 MG capsule Take 1 capsule (30 mg total) by mouth daily. Take with 60mg  for a total of 90mg  qd. 90 capsule 3  . DULoxetine (CYMBALTA) 60 MG capsule Take 1 capsule (60 mg total) by mouth daily. Take with 30mg  capsule for a total of 90mg  qd. 90 capsule 3  . EPINEPHrine 0.15 MG/0.15ML IJ injection Inject 0.15 mLs (0.15 mg total) into the muscle as needed for anaphylaxis. 2 Device 0  . fentaNYL (DURAGESIC - DOSED MCG/HR) 12 MCG/HR Place 12.5 mcg onto the skin every 3 (three) days.    . hydrALAZINE (APRESOLINE) 10 MG tablet Take 2 tablets (20 mg total) by mouth 3 (three) times daily as needed. 180 tablet 0  . ibuprofen  (ADVIL,MOTRIN) 800 MG tablet Take 800 mg by mouth 2 (two) times daily as needed.    . lamoTRIgine (LAMICTAL) 150 MG tablet Take 150 mg by mouth daily.    . metoprolol tartrate (LOPRESSOR) 25 MG tablet Take 1 tablet (25 mg total) by mouth 2 (two) times daily. 60 tablet 1  . midodrine (PROAMATINE) 2.5 MG tablet Take 1 tablet (2.5 mg total) by mouth 3 (three) times daily with meals. 30 tablet 0  . montelukast (SINGULAIR) 10 MG tablet Take 1 tablet (10 mg total) by mouth at bedtime. 30 tablet 3  . ondansetron (ZOFRAN) 4 MG tablet Take 4 mg by mouth every 8 (eight) hours as needed for nausea or vomiting.    . ondansetron (ZOFRAN-ODT) 4 MG disintegrating tablet DISSOLVE 1 tablet by MOUTH every EIGHT hours as needed for nausea or vomiting. 40 tablet 0  . oxyCODONE-acetaminophen (PERCOCET/ROXICET) 5-325 MG tablet Take by mouth every 8 (eight) hours as needed for severe pain.     . rizatriptan (MAXALT) 10 MG tablet Take 1 tablet (10 mg total) by mouth as needed for migraine. May repeat in 2 hours if needed. Max dose 30mg /day 30 tablet 1  . tiZANidine (ZANAFLEX) 4 MG tablet Take 4 mg by mouth every 6 (six) hours as needed for muscle spasms.    . traZODone (DESYREL) 100 MG tablet Take 1 tablet (100 mg total) by mouth at bedtime as needed for sleep. 60 tablet 1   No current facility-administered medications on file prior to visit.     Allergies  Allergen Reactions  . Ambien [Zolpidem Tartrate] Other (See Comments)    PTSD, nightmares   . Dilantin [Phenytoin]     Intravenously - it burns   . Naproxen Hives    There were no vitals taken for this visit.  No problem-specific Assessment & Plan notes found for this encounter.  Raquel Rodriguez-Guzman PharmD, BCPS, [place] [id number] 05/06/2017 9:28 AM

## 2017-05-12 DIAGNOSIS — M546 Pain in thoracic spine: Secondary | ICD-10-CM | POA: Diagnosis not present

## 2017-05-19 DIAGNOSIS — F4310 Post-traumatic stress disorder, unspecified: Secondary | ICD-10-CM | POA: Diagnosis not present

## 2017-05-21 ENCOUNTER — Encounter: Payer: Self-pay | Admitting: Neurology

## 2017-05-21 ENCOUNTER — Ambulatory Visit: Payer: BLUE CROSS/BLUE SHIELD | Admitting: Neurology

## 2017-05-21 VITALS — BP 158/102 | HR 87 | Ht 62.0 in | Wt 200.0 lb

## 2017-05-21 DIAGNOSIS — E237 Disorder of pituitary gland, unspecified: Secondary | ICD-10-CM | POA: Diagnosis not present

## 2017-05-21 DIAGNOSIS — R569 Unspecified convulsions: Secondary | ICD-10-CM

## 2017-05-21 DIAGNOSIS — G894 Chronic pain syndrome: Secondary | ICD-10-CM

## 2017-05-21 DIAGNOSIS — G43109 Migraine with aura, not intractable, without status migrainosus: Secondary | ICD-10-CM

## 2017-05-21 MED ORDER — LAMOTRIGINE 100 MG PO TABS: 100.0000 mg | ORAL_TABLET | Freq: Every day | ORAL | 6 refills | Status: DC

## 2017-05-21 MED ORDER — DICLOFENAC POTASSIUM(MIGRAINE) 50 MG PO PACK: PACK | ORAL | 5 refills | Status: AC

## 2017-05-21 NOTE — Patient Instructions (Signed)
1. Schedule MRI brain with and without contrast, as well as MRI pituitary gland with and without contrast 2. Increase Lamotrigine 100mg : take 1 tab in AM, 2 tabs in PM 3. Keep a calendar of your seizures and migraines (particularly with increase in Cymbalta dose) 4. We will try to get [facility] approved for migraine rescue. Minimize intake of any rescue medication to 2-3 times a week 5. Follow-up in 5-6 months, call for any changes  Seizure Precautions: 1. If medication has been prescribed for you to prevent seizures, take it exactly as directed.  Do not stop taking the medicine without talking to your doctor first, even if you have not had a seizure in a long time.   2. Avoid activities in which a seizure would cause danger to yourself or to others.  Don't operate dangerous machinery, swim alone, or climb in high or dangerous places, such as on ladders, roofs, or girders.  Do not drive unless your doctor says you may.  3. If you have any warning that you may have a seizure, lay down in a safe place where you can't hurt yourself.    4.  No driving for 6 months from last seizure, as per [facility].   Please refer to the following link on the [facility] website for more information: http://[email or web address]/[email or web address]/[email or web address]   5.  Maintain good sleep hygiene. Avoid alcohol  6.  Notify your neurology if you are planning pregnancy or if you become pregnant.  7.  Contact your doctor if you have any problems that may be related to the medicine you are taking.  8.  Call 911 and bring the patient back to the ED if:        A.  The seizure lasts longer than 5 minutes.       B.  The patient doesn't awaken shortly after the seizure  C.  The patient has new problems such as difficulty seeing, speaking or moving  D.  The patient was injured during the seizure  E.  The patient has a temperature over 102 F (39C)  F.  The patient vomited and  now is having trouble breathing
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
## 2017-05-27 ENCOUNTER — Encounter: Payer: Self-pay | Admitting: Neurology

## 2017-05-30 DIAGNOSIS — F4310 Post-traumatic stress disorder, unspecified: Secondary | ICD-10-CM | POA: Diagnosis not present

## 2017-06-03 ENCOUNTER — Ambulatory Visit (HOSPITAL_BASED_OUTPATIENT_CLINIC_OR_DEPARTMENT_OTHER): Payer: BLUE CROSS/BLUE SHIELD | Attending: Cardiovascular Disease | Admitting: Cardiovascular Disease

## 2017-06-03 DIAGNOSIS — G4733 Obstructive sleep apnea (adult) (pediatric): Secondary | ICD-10-CM | POA: Insufficient documentation

## 2017-06-03 DIAGNOSIS — R400 Somnolence: Secondary | ICD-10-CM | POA: Diagnosis not present

## 2017-06-03 DIAGNOSIS — G4710 Hypersomnia, unspecified: Secondary | ICD-10-CM | POA: Diagnosis present

## 2017-06-03 DIAGNOSIS — R5383 Other fatigue: Secondary | ICD-10-CM | POA: Insufficient documentation

## 2017-06-09 DIAGNOSIS — M546 Pain in thoracic spine: Secondary | ICD-10-CM | POA: Diagnosis not present

## 2017-06-10 ENCOUNTER — Encounter: Payer: Self-pay | Admitting: Physical Medicine & Rehabilitation

## 2017-06-10 ENCOUNTER — Other Ambulatory Visit: Payer: Self-pay

## 2017-06-10 ENCOUNTER — Encounter
Payer: BLUE CROSS/BLUE SHIELD | Attending: Physical Medicine & Rehabilitation | Admitting: Physical Medicine & Rehabilitation

## 2017-06-10 VITALS — BP 165/112 | HR 113

## 2017-06-10 DIAGNOSIS — E669 Obesity, unspecified: Secondary | ICD-10-CM | POA: Diagnosis not present

## 2017-06-10 DIAGNOSIS — Z813 Family history of other psychoactive substance abuse and dependence: Secondary | ICD-10-CM | POA: Diagnosis not present

## 2017-06-10 DIAGNOSIS — R0989 Other specified symptoms and signs involving the circulatory and respiratory systems: Secondary | ICD-10-CM | POA: Diagnosis not present

## 2017-06-10 DIAGNOSIS — G894 Chronic pain syndrome: Secondary | ICD-10-CM

## 2017-06-10 DIAGNOSIS — Z87820 Personal history of traumatic brain injury: Secondary | ICD-10-CM | POA: Insufficient documentation

## 2017-06-10 DIAGNOSIS — Z5181 Encounter for therapeutic drug level monitoring: Secondary | ICD-10-CM

## 2017-06-10 DIAGNOSIS — J45909 Unspecified asthma, uncomplicated: Secondary | ICD-10-CM | POA: Diagnosis not present

## 2017-06-10 DIAGNOSIS — M797 Fibromyalgia: Secondary | ICD-10-CM | POA: Insufficient documentation

## 2017-06-10 DIAGNOSIS — G40909 Epilepsy, unspecified, not intractable, without status epilepticus: Secondary | ICD-10-CM | POA: Insufficient documentation

## 2017-06-10 DIAGNOSIS — D497 Neoplasm of unspecified behavior of endocrine glands and other parts of nervous system: Secondary | ICD-10-CM | POA: Diagnosis not present

## 2017-06-10 DIAGNOSIS — G8929 Other chronic pain: Secondary | ICD-10-CM | POA: Diagnosis present

## 2017-06-10 DIAGNOSIS — F4310 Post-traumatic stress disorder, unspecified: Secondary | ICD-10-CM

## 2017-06-10 DIAGNOSIS — G43109 Migraine with aura, not intractable, without status migrainosus: Secondary | ICD-10-CM | POA: Diagnosis not present

## 2017-06-10 DIAGNOSIS — Z8249 Family history of ischemic heart disease and other diseases of the circulatory system: Secondary | ICD-10-CM | POA: Diagnosis not present

## 2017-06-10 DIAGNOSIS — M549 Dorsalgia, unspecified: Secondary | ICD-10-CM | POA: Diagnosis not present

## 2017-06-10 DIAGNOSIS — G43909 Migraine, unspecified, not intractable, without status migrainosus: Secondary | ICD-10-CM | POA: Insufficient documentation

## 2017-06-10 DIAGNOSIS — E349 Endocrine disorder, unspecified: Secondary | ICD-10-CM | POA: Diagnosis not present

## 2017-06-10 DIAGNOSIS — Z79899 Other long term (current) drug therapy: Secondary | ICD-10-CM | POA: Insufficient documentation

## 2017-06-10 MED ORDER — AMITRIPTYLINE HCL 10 MG PO TABS: 10.0000 mg | ORAL_TABLET | Freq: Every day | ORAL | 2 refills | Status: DC
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
## 2017-06-10 NOTE — Patient Instructions (Signed)
PLEASE FEEL FREE TO CALL OUR OFFICE WITH ANY PROBLEMS OR QUESTIONS ([phone])      

## 2017-06-13 DIAGNOSIS — F4310 Post-traumatic stress disorder, unspecified: Secondary | ICD-10-CM | POA: Diagnosis not present

## 2017-06-14 LAB — DRUG TOX MONITOR 1 W/CONF, ORAL FLD
Amobarbital: NEGATIVE ng/mL (ref <10)
Amphetamines: NEGATIVE ng/mL (ref <10)
BUTALBITAL: 30 ng/mL — AB (ref <10)
Barbiturates: POSITIVE ng/mL — AB (ref <10)
Benzodiazepines: NEGATIVE ng/mL (ref <0.50)
Buprenorphine: NEGATIVE ng/mL (ref <0.10)
CODEINE: NEGATIVE ng/mL (ref <2.5)
Cocaine: NEGATIVE ng/mL (ref <5.0)
DIHYDROCODEINE: NEGATIVE ng/mL (ref <2.5)
FENTANYL: 0.71 ng/mL — AB (ref <0.10)
FENTANYL: POSITIVE ng/mL — AB (ref <0.10)
HEROIN METABOLITE: NEGATIVE ng/mL (ref <1.0)
HYDROMORPHONE: NEGATIVE ng/mL (ref <2.5)
Hydrocodone: NEGATIVE ng/mL (ref <2.5)
MARIJUANA: NEGATIVE ng/mL (ref <2.5)
MDMA: NEGATIVE ng/mL (ref <10)
MORPHINE: NEGATIVE ng/mL (ref <2.5)
Meprobamate: NEGATIVE ng/mL (ref <2.5)
Methadone: NEGATIVE ng/mL (ref <5.0)
Nicotine Metabolite: NEGATIVE ng/mL (ref <5.0)
Norhydrocodone: NEGATIVE ng/mL (ref <2.5)
Noroxycodone: 10.6 ng/mL — ABNORMAL HIGH (ref <2.5)
Opiates: POSITIVE ng/mL — AB (ref <2.5)
Oxycodone: 63.5 ng/mL — ABNORMAL HIGH (ref <2.5)
Oxymorphone: NEGATIVE ng/mL (ref <2.5)
PENTOBARBITAL: NEGATIVE ng/mL (ref <10)
Phencyclidine: NEGATIVE ng/mL (ref <10)
Phenobarbital: NEGATIVE ng/mL (ref <10)
Secobarbital: NEGATIVE ng/mL (ref <10)
TAPENTADOL: NEGATIVE ng/mL (ref <5.0)
TRAMADOL: NEGATIVE ng/mL (ref <5.0)
ZOLPIDEM: NEGATIVE ng/mL (ref <5.0)

## 2017-06-14 LAB — DRUG TOX ALC METAB W/CON, ORAL FLD: Alcohol Metabolite: NEGATIVE ng/mL (ref <25)

## 2017-06-18 ENCOUNTER — Ambulatory Visit (INDEPENDENT_AMBULATORY_CARE_PROVIDER_SITE_OTHER): Payer: BLUE CROSS/BLUE SHIELD | Admitting: Allergy and Immunology

## 2017-06-18 ENCOUNTER — Encounter: Payer: Self-pay | Admitting: Allergy and Immunology

## 2017-06-18 VITALS — BP 110/68 | HR 96 | Temp 98.0°F | Resp 16 | Ht 62.0 in | Wt 205.0 lb

## 2017-06-18 DIAGNOSIS — J3089 Other allergic rhinitis: Secondary | ICD-10-CM

## 2017-06-18 DIAGNOSIS — R062 Wheezing: Secondary | ICD-10-CM | POA: Diagnosis not present

## 2017-06-18 DIAGNOSIS — T781XXD Other adverse food reactions, not elsewhere classified, subsequent encounter: Secondary | ICD-10-CM

## 2017-06-18 DIAGNOSIS — H1010 Acute atopic conjunctivitis, unspecified eye: Secondary | ICD-10-CM | POA: Insufficient documentation

## 2017-06-18 DIAGNOSIS — J302 Other seasonal allergic rhinitis: Secondary | ICD-10-CM | POA: Diagnosis not present

## 2017-06-18 DIAGNOSIS — H1013 Acute atopic conjunctivitis, bilateral: Secondary | ICD-10-CM

## 2017-06-18 DIAGNOSIS — J4530 Mild persistent asthma, uncomplicated: Secondary | ICD-10-CM

## 2017-06-18 DIAGNOSIS — J45991 Cough variant asthma: Secondary | ICD-10-CM | POA: Diagnosis not present

## 2017-06-18 DIAGNOSIS — T781XXA Other adverse food reactions, not elsewhere classified, initial encounter: Secondary | ICD-10-CM | POA: Insufficient documentation

## 2017-06-18 MED ORDER — OLOPATADINE HCL 0.7 % OP SOLN: 1.0000 [drp] | Freq: Every day | OPHTHALMIC | 5 refills | Status: DC | PRN

## 2017-06-18 MED ORDER — MONTELUKAST SODIUM 10 MG PO TABS: 10.0000 mg | ORAL_TABLET | Freq: Every day | ORAL | 5 refills | Status: DC

## 2017-06-18 MED ORDER — FLUTICASONE PROPIONATE HFA 110 MCG/ACT IN AERO: 2.0000 | INHALATION_SPRAY | Freq: Two times a day (BID) | RESPIRATORY_TRACT | 5 refills | Status: DC

## 2017-06-18 MED ORDER — LEVOCETIRIZINE DIHYDROCHLORIDE 5 MG PO TABS: 5.0000 mg | ORAL_TABLET | Freq: Every day | ORAL | 5 refills | Status: DC | PRN

## 2017-06-18 MED ORDER — FLUTICASONE PROPIONATE 50 MCG/ACT NA SUSP: NASAL | 5 refills | Status: AC

## 2017-06-18 MED ORDER — ALBUTEROL SULFATE HFA 108 (90 BASE) MCG/ACT IN AERS: 2.0000 | INHALATION_SPRAY | RESPIRATORY_TRACT | 2 refills | Status: DC | PRN

## 2017-06-18 NOTE — Assessment & Plan Note (Signed)
   Treatment plan as outlined above for allergic rhinitis.  A prescription has been provided for Pazeo, one drop per eye daily as needed.  I have also recommended eye lubricant drops (i.e., Natural Tears) as needed. 

## 2017-06-18 NOTE — Assessment & Plan Note (Signed)
   Montelukast 10 mg daily at bedtime.  A refill prescription has been provided.  New albuterol HFA, 1-2 inhalations every 4-6 hours as needed.  During respiratory tract infections or asthma flares, add Flovent 110g 2 inhalations 2 times per day until symptoms have returned to baseline.  Subjective and objective measures of pulmonary function will be followed and the treatment plan will be adjusted accordingly.

## 2017-06-18 NOTE — Patient Instructions (Signed)
Other allergic rhinitis  Aeroallergen avoidance measures have been discussed and provided in written form.  A prescription has been provided for levocetirizine, 5 mg daily as needed.  A prescription has been provided for fluticasone nasal spray, one spray per nostril 1-2 times daily as needed. Proper nasal spray technique has been discussed and demonstrated.  Nasal saline spray (i.e., Simply Saline) or nasal saline lavage (i.e., NeilMed) is recommended as needed and prior to medicated nasal sprays.  The risks and benefits of aeroallergen immunotherapy have been discussed. The patient is motivated to initiate immunotherapy to reduce symptoms and decrease medication requirement. Informed consent has been signed and allergen vaccine orders have been submitted. Medications will be decreased or discontinued as symptom relief from immunotherapy becomes evident.  Allergic conjunctivitis  Treatment plan as outlined above for allergic rhinitis.  A prescription has been provided for Pazeo, one drop per eye daily as needed.  I have also recommended eye lubricant drops (i.e., Natural Tears) as needed.  Oral allergy syndrome Oral allergy syndrome.  The patient's history and skin test results support a diagnosis of oral allergy syndrome (OAS). Peeling or cooking the food has shown to reduce symptoms and antihistamines may also relieve symptoms. Immunotherapy to the cross reacting pollens has improved or cured OAS in many patients, though this has not been consistent for all patients. Typically OAS is limited to itching or swelling of mucosal tissues from the lips to the back of the throat.   Information about OAS has been discussed and provided in written form.  All foods causing symptoms are to be avoided.  Should symptoms progress beyond the mouth and throat, 911 is to be called immediately.  Mild persistent asthma  Montelukast 10 mg daily at bedtime.  A refill prescription has been provided.  New  albuterol HFA, 1-2 inhalations every 4-6 hours as needed.  During respiratory tract infections or asthma flares, add Flovent 110g 2 inhalations 2 times per day until symptoms have returned to baseline.  Subjective and objective measures of pulmonary function will be followed and the treatment plan will be adjusted accordingly.   Return in about 3 months (around 09/16/2017), or if symptoms worsen or fail to improve.  Reducing Pollen Exposure  The American Academy of [employer], [employer] and Immun[employer] suggests the following steps to reduce your exposure to pollen during allergy seasons.    1. Do not hang sheets or clothing out to dry; pollen may collect on these items. 2. Do not mow lawns or spend time around freshly cut grass; mowing stirs up pollen. 3. Keep windows closed at night.  Keep car windows closed while driving. 4. Minimize morning activities outdoors, a time when pollen counts are usually at their highest. 5. Stay indoors as much as possible when pollen counts or humidity is high and on windy days when pollen tends to remain in the air longer. 6. Use air conditioning when possible.  Many air conditioners have filters that trap the pollen spores. 7. Use a HEPA room air filter to remove pollen form the indoor air you breathe.   Control of House Dust Mite Allergen  House dust mites play a major role in allergic asthma and rhinitis.  They occur in environments with high humidity wherever human skin, the food for dust mites is found. High levels have been detected in dust obtained from mattresses, pillows, carpets, upholstered furniture, bed covers, clothes and soft toys.  The principal allergen of the house dust mite is found in its feces.  A gram of dust may contain 1,000 mites and 250,000 fecal particles.  Mite antigen is easily measured in the air during house cleaning activities.    1. Encase mattresses, including the box spring, and pillow, in an air tight cover.  Seal the zipper end  of the encased mattresses with wide adhesive tape. 2. Wash the bedding in water of 130 degrees Farenheit weekly.  Avoid cotton comforters/quilts and flannel bedding: the most ideal bed covering is the dacron comforter. 3. Remove all upholstered furniture from the bedroom. 4. Remove carpets, carpet padding, rugs, and non-washable window drapes from the bedroom.  Wash drapes weekly or use plastic window coverings. 5. Remove all non-washable stuffed toys from the bedroom.  Wash stuffed toys weekly. 6. Have the room cleaned frequently with a vacuum cleaner and a damp dust-mop.  The patient should not be in a room which is being cleaned and should wait 1 hour after cleaning before going into the room. 7. Close and seal all heating outlets in the bedroom.  Otherwise, the room will become filled with dust-laden air.  An electric heater can be used to heat the room. Reduce indoor humidity to less than 50%.  Do not use a humidifier.  Control of Dog or Cat Allergen  Avoidance is the best way to manage a dog or cat allergy. If you have a dog or cat and are allergic to dog or cats, consider removing the dog or cat from the home. If you have a dog or cat but don't want to find it a new home, or if your family wants a pet even though someone in the household is allergic, here are some strategies that may help keep symptoms at bay:  1. Keep the pet out of your bedroom and restrict it to only a few rooms. Be advised that keeping the dog or cat in only one room will not limit the allergens to that room. 2. Don't pet, hug or kiss the dog or cat; if you do, wash your hands with soap and water. 3. High-efficiency particulate air (HEPA) cleaners run continuously in a bedroom or living room can reduce allergen levels over time. 4. Place electrostatic material sheet in the air inlet vent in the bedroom. 5. Regular use of a high-efficiency vacuum cleaner or a central vacuum can reduce allergen levels. 6. Giving your dog  or cat a bath at least once a week can reduce airborne allergen.  Control of Mold Allergen  Mold and fungi can grow on a variety of surfaces provided certain temperature and moisture conditions exist.  Outdoor molds grow on plants, decaying vegetation and soil.  The major outdoor mold, Alternaria and Cladosporium, are found in very high numbers during hot and dry conditions.  Generally, a late Summer - Fall peak is seen for common outdoor fungal spores.  Rain will temporarily lower outdoor mold spore count, but counts rise rapidly when the rainy period ends.  The most important indoor molds are Aspergillus and Penicillium.  Dark, humid and poorly ventilated basements are ideal sites for mold growth.  The next most common sites of mold growth are the bathroom and the kitchen.  Outdoor [employer] 1. Use air conditioning and keep windows closed 2. Avoid exposure to decaying vegetation. 3. Avoid leaf raking. 4. Avoid grain handling. 5. Consider wearing a face mask if working in moldy areas.  Indoor Mold Control 1. Maintain humidity below 50%. 2. Clean washable surfaces with 5% bleach solution. 3. Remove sources e.g.  Contaminated carpets.  Control of Cockroach Allergen  Cockroach allergen has been identified as an important cause of acute attacks of asthma, especially in urban settings.  There are fifty-five species of cockroach that exist in the [place], however only three, the [place], [place] species produce allergen that can affect patients with Asthma.  Allergens can be obtained from fecal particles, egg casings and secretions from cockroaches.    1. Remove food sources. 2. Reduce access to water. 3. Seal access and entry points. 4. Spray runways with 0.5-1% Diazinon or Chlorpyrifos 5. Blow boric acid power under stoves and refrigerator. 6. Place bait stations (hydramethylnon) at feeding sites.   Oral Allergy Syndrome (OAS)  Oral Allergy Syndrome or OAS is an  allergic reaction to certain (usually fresh) fruits, nuts, and vegetables. The allergy is not actually an allergy to food but a syndrome that develops in pollen allergy sufferers. The immune system mistakes the food proteins for the pollen proteins and causes an allergic reaction. For instance, an allergy to ragweed is associated with OAS reactions to banana, watermelon, cantaloupe, honeydew, zucchini, and cucumber. This does not mean that all sufferers of an allergy to ragweed will experience adverse effects from all or even any of these foods. Reactions may begin with one type of food and with reactions to others developing later. However, reaction to one or more foods in any given category does not necessarily mean a person is allergic to all foods in that group. OAS sufferers may have a number of reactions that usually occur very rapidly, within minutes of eating a trigger food. The most common reaction is an itching or burning sensation in the lips, mouth, and/or pharynx. Sometimes other reactions can be triggered in the eyes, nose, and skin. The most severe reactions can result in asthma problems or anaphylaxis.  If a sufferer is able to swallow the food, there is a good chance that there will be a reaction later in the gastrointestinal tract. Vomiting, diarrhea, severe indigestion, or cramps may occur.  Treatment: An OAS sufferer should avoid foods to which they are allergic. Peeling or cooking the food has shown to reduce symptoms in the throat and mouth, but may not relieve symptoms in the gastrointestinal tract. Antihistamines may also relieve the symptoms of the allergy. Persons with severe reactions may consider carrying injectable epinephrine should systemic symptoms occur. Allergy immunotherapy to the pollens has improved or cured OAS in many patients, though this has not been consistent for all patients. [place] pollen: almonds, apples, celery, cherries, hazel nuts, peaches, pears, parsley,  strawberry, raspberry . Birch pollen: almonds, apples, apricots, avocados, bananas, carrots, celery, cherries, chicory, coriander, fennel, fig, hazel nuts, kiwifruit, nectarines, parsley, parsnips, peaches, pears, peppers, plums, potatoes, prunes, soy, strawberries, wheat; Potential: walnuts . Grass pollen: fig, melons, tomatoes, oranges . Mugwort pollen : carrots, celery, coriander, fennel, parsley, peppers, sunflower . Ragweed pollen : banana, cantaloupe, cucumber, green pepper, paprika, sunflower seeds/oil, honeydew, watermelon, zucchini, echinacea, artichoke, dandelions, honey (if bees pollinate from wild flowers), hibiscus or chamomile tea . Possible cross-reactions (to any of the above): berries (strawberries, blueberries, raspberries, etc), citrus (oranges, lemons, etc), grapes, mango, figs, peanut, pineapple, pomegranates, watermelon

## 2017-06-18 NOTE — Assessment & Plan Note (Deleted)
Oral allergy syndrome.  The patient's history and skin test results support a diagnosis of oral allergy syndrome (OAS). Peeling or cooking the food has shown to reduce symptoms and antihistamines may also relieve symptoms. Immunotherapy to the cross reacting pollens has improved or cured OAS in many patients, though this has not been consistent for all patients. Typically OAS is limited to itching or swelling of mucosal tissues from the lips to the back of the throat.   Information about OAS has been discussed and provided in written form.  All foods causing symptoms are to be avoided.  Should symptoms progress beyond the mouth and throat, 911 is to be called immediately. 

## 2017-06-18 NOTE — Assessment & Plan Note (Signed)
Oral allergy syndrome.  The patient's history and skin test results support a diagnosis of oral allergy syndrome (OAS). Peeling or cooking the food has shown to reduce symptoms and antihistamines may also relieve symptoms. Immunotherapy to the cross reacting pollens has improved or cured OAS in many patients, though this has not been consistent for all patients. Typically OAS is limited to itching or swelling of mucosal tissues from the lips to the back of the throat.   Information about OAS has been discussed and provided in written form.  All foods causing symptoms are to be avoided.  Should symptoms progress beyond the mouth and throat, 911 is to be called immediately. 

## 2017-06-18 NOTE — Assessment & Plan Note (Signed)
   Aeroallergen avoidance measures have been discussed and provided in written form.  A prescription has been provided for levocetirizine, 5 mg daily as needed.  A prescription has been provided for fluticasone nasal spray, one spray per nostril 1-2 times daily as needed. Proper nasal spray technique has been discussed and demonstrated.  Nasal saline spray (i.e., Simply Saline) or nasal saline lavage (i.e., NeilMed) is recommended as needed and prior to medicated nasal sprays.  The risks and benefits of aeroallergen immunotherapy have been discussed. The patient is motivated to initiate immunotherapy to reduce symptoms and decrease medication requirement. Informed consent has been signed and allergen vaccine orders have been submitted. Medications will be decreased or discontinued as symptom relief from immunotherapy becomes evident.

## 2017-06-18 NOTE — Progress Notes (Signed)
New Patient Note  RE: Kristine Kelley MRN: [id number] DOB: 01-13-1991 Date of Office Visit: 06/18/2017  Referring provider: Desiree Lucy* Primary care provider: Dorise Hiss, PA-C  Chief Complaint: Allergic Reaction; Food Intolerance; Allergic Rhinitis ; and Asthma   History of present illness: Kristine Kelley is a 26 y.o. female seen today in consultation requested by [employer], PA-C.  She reports that since childhood she has experienced oral pruritus with the consumption of bananas.  Despite this, she kept consuming them until she was 26 years old when, on one occasion, her "mouth got super itchy" and she had difficulty swallowing.  At that point, she stopped eating bananas.  She had similar symptoms with the consumption of avocado so eliminated this food from her diet as well.  She did not experience cardiopulmonary or other GI symptoms such as nausea, vomiting, or diarrhea.  She reports that over this past year she has also been experiencing oral pruritus with consumption of pineapple, tomato, grapes, watermelon, and cantaloupe.  She notes that she is able to consume cooked/processed tomato, does not fresh/raw tomato. She experiences frequent and severe nasal congestion, rhinorrhea, sneezing, postnasal drainage, nasal pruritus, ocular pruritus, periorbital edema, and sinus pressure.  These symptoms occur year around but tend to be triggered by exposure to pollen, dogs, and horses. [name] was born 4 months premature and has had asthma symptoms since infancy.  She was officially diagnosed with asthma when she was approximately 26 years of age.  Her typical asthma symptoms include coughing and wheezing.  Her asthma symptoms are triggered by laughing, upper respiratory tract infections, exercise, and exposure to pollen, dogs, and cats.  She had been taking montelukast 10 mg daily at bedtime, however ran out of this medication approximately 2 months  ago.   Assessment and plan: Perennial and seasonal allergic rhinitis  Aeroallergen avoidance measures have been discussed and provided in written form.  A prescription has been provided for levocetirizine, 5 mg daily as needed.  A prescription has been provided for fluticasone nasal spray, one spray per nostril 1-2 times daily as needed. Proper nasal spray technique has been discussed and demonstrated.  Nasal saline spray (i.e., Simply Saline) or nasal saline lavage (i.e., NeilMed) is recommended as needed and prior to medicated nasal sprays.  The risks and benefits of aeroallergen immunotherapy have been discussed. The patient is motivated to initiate immunotherapy to reduce symptoms and decrease medication requirement. Informed consent has been signed and allergen vaccine orders have been submitted. Medications will be decreased or discontinued as symptom relief from immunotherapy becomes evident.  Allergic conjunctivitis  Treatment plan as outlined above for allergic rhinitis.  A prescription has been provided for Pazeo, one drop per eye daily as needed.  I have also recommended eye lubricant drops (i.e., Natural Tears) as needed.  Oral allergy syndrome Oral allergy syndrome.  The patient's history and skin test results support a diagnosis of oral allergy syndrome (OAS). Peeling or cooking the food has shown to reduce symptoms and antihistamines may also relieve symptoms. Immunotherapy to the cross reacting pollens has improved or cured OAS in many patients, though this has not been consistent for all patients. Typically OAS is limited to itching or swelling of mucosal tissues from the lips to the back of the throat.   Information about OAS has been discussed and provided in written form.  All foods causing symptoms are to be avoided.  Should symptoms progress beyond the mouth and throat, 911 is to  be called immediately.  Mild persistent asthma  Montelukast 10 mg daily at bedtime.   A refill prescription has been provided.  New albuterol HFA, 1-2 inhalations every 4-6 hours as needed.  During respiratory tract infections or asthma flares, add Flovent 110g 2 inhalations 2 times per day until symptoms have returned to baseline.  Subjective and objective measures of pulmonary function will be followed and the treatment plan will be adjusted accordingly.   Meds ordered this encounter  Medications  . montelukast (SINGULAIR) 10 MG tablet    Sig: Take 1 tablet (10 mg total) by mouth at bedtime.    Dispense:  30 tablet    Refill:  5  . albuterol (PROVENTIL HFA;VENTOLIN HFA) 108 (90 Base) MCG/ACT inhaler    Sig: Inhale 2 puffs into the lungs every 4 (four) hours as needed for wheezing or shortness of breath (cough, shortness of breath or wheezing.).    Dispense:  1 Inhaler    Refill:  2  . fluticasone (FLOVENT HFA) 110 MCG/ACT inhaler    Sig: Inhale 2 puffs into the lungs 2 (two) times daily. During asthma flares. Use with spacer device.    Dispense:  1 Inhaler    Refill:  5  . Olopatadine HCl (PAZEO) 0.7 % SOLN    Sig: Place 1 drop into both eyes daily as needed.    Dispense:  1 Bottle    Refill:  5  . fluticasone (FLONASE) 50 MCG/ACT nasal spray    Sig: Use 1 spray per nostril 1-2 times daily as needed    Dispense:  16 g    Refill:  5  . levocetirizine (XYZAL) 5 MG tablet    Sig: Take 1 tablet (5 mg total) by mouth daily as needed for allergies.    Dispense:  30 tablet    Refill:  5    Diagnostics: Spirometry:  Normal with an FEV1 of 107% predicted.  Please see scanned spirometry results for details. Epicutaneous testing: Positive to grass pollen, weed pollen, ragweed pollen, tree pollen, and horse epithelia Intradermal testing: Positive to molds, cat hair, dog epithelia, and cockroach antigen.   Physical examination: Blood pressure 110/68, pulse 96, temperature 98 F (36.7 C), temperature source Oral, resp. rate 16, height 5\' 2"  (1.575 m), weight 205 lb  (93 kg), SpO2 97 %.  General: Alert, interactive, in no acute distress. HEENT: TMs pearly gray, turbinates edematous with clear discharge, post-pharynx mildly erythematous. Neck: Supple without lymphadenopathy. Lungs: Clear to auscultation without wheezing, rhonchi or rales. CV: Normal S1, S2 without murmurs. Abdomen: Nondistended, nontender. Skin: Warm and dry, without lesions or rashes. Extremities:  No clubbing, cyanosis or edema. Neuro:   Grossly intact.  Review of systems:  Review of systems negative except as noted in HPI / PMHx or noted below: Review of Systems  Constitutional: Negative.   HENT: Negative.   Eyes: Negative.   Respiratory: Negative.   Cardiovascular: Negative.   Gastrointestinal: Negative.   Genitourinary: Negative.   Musculoskeletal: Negative.   Skin: Negative.   Neurological: Negative.   Endo/Heme/Allergies: Negative.   Psychiatric/Behavioral: Negative.     Past medical history:  Past Medical History:  Diagnosis Date  . Asthma   . Benign tumor of pituitary gland ([facility])   . Chronic back pain   . Chronic pain   . Fibromyalgia   . Migraine   . PTSD (post-traumatic stress disorder)   . Seizures ([place])   . Traumatic brain injury [facility])     Past surgical  history:  Past Surgical History:  Procedure Laterality Date  . APPENDECTOMY    . KIDNEY STONE SURGERY      Family history: Family History  Problem Relation Age of Onset  . Heart disease Mother   . Cancer Mother   . Drug abuse Mother   . Heart disease Father   . Drug abuse Father   . Asthma Brother   . Allergic rhinitis Neg Hx   . Eczema Neg Hx   . Urticaria Neg Hx   . Immunodeficiency Neg Hx   . Angioedema Neg Hx     Social history: Social History   Socioeconomic History  . Marital status: Married    Spouse name: Not on file  . Number of children: Not on file  . Years of education: Not on file  . Highest education level: Not on file  Social Needs  . Financial resource strain:  Not on file  . Food insecurity - worry: Not on file  . Food insecurity - inability: Not on file  . Transportation needs - medical: Not on file  . Transportation needs - non-medical: Not on file  Occupational History  . Not on file  Tobacco Use  . Smoking status: Never Smoker  . Smokeless tobacco: Never Used  Substance and Sexual Activity  . Alcohol use: No  . Drug use: No  . Sexual activity: Yes    Birth control/protection: Pill  Other Topics Concern  . Not on file  Social History Narrative   Pt lives in 1 story home with her husband   Has 1 step child who does not reside with pt   Some college   Nurse, children's.    Environmental History: The patient lives in a house built in the 1960s with hardwood floors throughout, gas heat, and central air.  There is mold/water damage in the home.  There are dogs in the house which have access to her bedroom.  She is a non-smoker.  Allergies as of 06/18/2017      Reactions   Ambien [zolpidem Tartrate] Other (See Comments)   PTSD, nightmares   Dilantin [phenytoin]    Intravenously - it burns    Naproxen Hives      Medication List        Accurate as of 06/18/17  5:44 PM. Always use your most recent med list.          albuterol 108 (90 Base) MCG/ACT inhaler Commonly known as:  PROVENTIL HFA;VENTOLIN HFA Inhale 2 puffs into the lungs every 4 (four) hours as needed for wheezing or shortness of breath (cough, shortness of breath or wheezing.).   amitriptyline 10 MG tablet Commonly known as:  ELAVIL Take 1-2 tablets (10-20 mg total) by mouth at bedtime.   desogestrel-ethinyl estradiol 0.15-30 MG-MCG tablet Commonly known as:  APRI,EMOQUETTE,SOLIA Take 1 tablet by mouth daily.   Diclofenac Potassium 50 MG Pack Commonly known as:  CAMBIA Use 1 packet at onset of migraine. Do not use more than 3 times a week   DULoxetine 60 MG capsule Commonly known as:  CYMBALTA Take 1 capsule (60 mg total) by mouth  daily. Take with 30mg  capsule for a total of 90mg  qd.   DULoxetine 30 MG capsule Commonly known as:  CYMBALTA Take 1 capsule (30 mg total) by mouth daily. Take with 60mg  for a total of 90mg  qd.   EPINEPHrine 0.15 MG/0.15ML injection Commonly known as:  EPIPEN JR Inject 0.15 mLs (0.15 mg total) into  the muscle as needed for anaphylaxis.   fentaNYL 12 MCG/HR Commonly known as:  DURAGESIC - dosed mcg/hr Place 12.5 mcg onto the skin every 3 (three) days.   fluticasone 110 MCG/ACT inhaler Commonly known as:  FLOVENT HFA Inhale 2 puffs into the lungs 2 (two) times daily. During asthma flares. Use with spacer device.   fluticasone 50 MCG/ACT nasal spray Commonly known as:  FLONASE Use 1 spray per nostril 1-2 times daily as needed   hydrALAZINE 10 MG tablet Commonly known as:  APRESOLINE Take 2 tablets (20 mg total) by mouth 3 (three) times daily as needed.   ibuprofen 800 MG tablet Commonly known as:  ADVIL,MOTRIN Take 800 mg by mouth 2 (two) times daily as needed.   lamoTRIgine 100 MG tablet Commonly known as:  LAMICTAL Take 1 tablet (100 mg total) daily by mouth. Take 1 tab in AM, 2 tabs in PM   levocetirizine 5 MG tablet Commonly known as:  XYZAL Take 1 tablet (5 mg total) by mouth daily as needed for allergies.   metoprolol tartrate 25 MG tablet Commonly known as:  LOPRESSOR Take 1 tablet (25 mg total) by mouth 2 (two) times daily.   montelukast 10 MG tablet Commonly known as:  SINGULAIR Take 1 tablet (10 mg total) by mouth at bedtime.   Olopatadine HCl 0.7 % Soln Commonly known as:  PAZEO Place 1 drop into both eyes daily as needed.   ondansetron 4 MG tablet Commonly known as:  ZOFRAN Take 4 mg by mouth every 8 (eight) hours as needed for nausea or vomiting.   oxyCODONE-acetaminophen 5-325 MG tablet Commonly known as:  PERCOCET/ROXICET Take by mouth every 8 (eight) hours as needed for severe pain.   traZODone 100 MG tablet Commonly known as:  DESYREL Take 1  tablet (100 mg total) by mouth at bedtime as needed for sleep.       Known medication allergies: Allergies  Allergen Reactions  . Ambien [Zolpidem Tartrate] Other (See Comments)    PTSD, nightmares   . Dilantin [Phenytoin]     Intravenously - it burns   . Naproxen Hives    I appreciate the opportunity to take part in Yanelli's care. Please do not hesitate to contact me with questions.  Sincerely,   R. Edgar Frisk, MD

## 2017-06-19 ENCOUNTER — Telehealth: Payer: Self-pay | Admitting: *Deleted

## 2017-06-19 DIAGNOSIS — F4310 Post-traumatic stress disorder, unspecified: Secondary | ICD-10-CM | POA: Diagnosis not present

## 2017-06-19 NOTE — Telephone Encounter (Signed)
-----   Message from Adelina Mings, MD sent at 06/18/2017  5:42 PM EST ----- Please call patient and have her call her insurance with the immunotherapy billing codes to make sure that she would like to proceed with immunotherapy before we mix the vials.  I had thought that she had a different insurance when I was talking to her about the cost of immunotherapy.  Thank you.

## 2017-06-19 NOTE — Telephone Encounter (Signed)
Spoke with pt yesterday and instructed her to call her insurance before she makes a start injection apt. Pt will call us back after finding out.

## 2017-06-24 ENCOUNTER — Encounter (HOSPITAL_BASED_OUTPATIENT_CLINIC_OR_DEPARTMENT_OTHER): Payer: Self-pay | Admitting: Cardiovascular Disease

## 2017-06-24 NOTE — Procedures (Signed)
     Patient Name: Kristine Kelley, Kristine Kelley Date: 06/03/2017 Gender: Female D.O.B: Mar 17, 1991 Age (years): 25 Referring Provider: Lorretta Harp Height (inches): 65 Interpreting Physician: Shelva Majestic MD, ABSM Weight (lbs): 200 RPSGT: Zadie Rhine BMI: 37 MRN: [id number] Neck Size: 16.00  CLINICAL INFORMATION Sleep Study Type: NPSG  Indication for sleep study: Excessive Daytime Sleepiness, OSA  Epworth Sleepiness Score: 6  SLEEP STUDY TECHNIQUE As per the AASM Manual for the Scoring of Sleep and Associated Events v2.3 (April 2016) with a hypopnea requiring 4% desaturations.  The channels recorded and monitored were frontal, central and occipital EEG, electrooculogram (EOG), submentalis EMG (chin), nasal and oral airflow, thoracic and abdominal wall motion, anterior tibialis EMG, snore microphone, electrocardiogram, and pulse oximetry.  MEDICATIONS Lisinopril 10mg  daily Hydralazine 20mg  TID PRN for SBP >150 or DBP >100  Medications self-administered by patient taken the night of the study : TRAZODONE, PERCOCET/ROXICET, LOPRESSOR, FENTANYL  SLEEP ARCHITECTURE The study was initiated at 10:14:44 PM and ended at 4:13:49 AM.  Sleep onset time was 17.2 minutes and the sleep efficiency was 88.4%. The total sleep time was 317.5 minutes.  Stage REM latency was 82.5 minutes.  The patient spent 5.51% of the night in stage N1 sleep, 53.23% in stage N2 sleep, 26.30% in stage N3 and 14.96% in REM.  Alpha intrusion was absent.  Supine sleep was 31.82%.  RESPIRATORY PARAMETERS The overall apnea/hypopnea index (AHI) was 0.0 per hour. There were 0 total apneas, including 0 obstructive, 0 central and 0 mixed apneas. There were 0 hypopneas and 12 RERAs.  The AHI during Stage REM sleep was 0.0 per hour.  AHI while supine was 0.0 per hour.  The mean oxygen saturation was 94.90%. The minimum SpO2 during sleep was 91.00%.  No snoring was noted during this study.  CARDIAC DATA The  2 lead EKG demonstrated sinus rhythm. The mean heart rate was 97.28 beats per minute. Other EKG findings include: None.  LEG MOVEMENT DATA The total PLMS were 0 with a resulting PLMS index of 0.00. Associated arousal with leg movement index was 0.0 .  IMPRESSIONS - No significant obstructive sleep apnea occurred during this study (AHI = 0.0/h). - No significant central sleep apnea occurred during this study (CAI = 0.0/h). - The patient had minimal or no oxygen desaturation during the study (Min O2 = 91.00%) - No snoring was audible during this study. - Normal sleep architecture and sleep efficiency - No cardiac abnormalities were noted during this study. - Clinically significant periodic limb movements did not occur during sleep. No significant associated arousals.  DIAGNOSIS: - Fatigue - Daytime Somnolence  RECOMMENDATIONS - There is no significant breathing disorder of sleep. - Avoid alcohol, sedatives and other CNS depressants that may worsen sleep apnea and disrupt normal sleep architecture. - Sleep hygiene should be reviewed to assess factors that may improve sleep quality. - Weight management (BMI 37) and regular exercise should be initiated or continued..  [Electronically signed] 06/24/2017 03:52 PM  Shelva Majestic MD, [facility], [place], American Board of Sleep Medicine   NPI: [id number] [place] PH: [phone]   FX: [phone] [place]

## 2017-06-25 ENCOUNTER — Telehealth: Payer: Self-pay | Admitting: *Deleted

## 2017-06-25 NOTE — Progress Notes (Signed)
lmtcb 12/12

## 2017-06-25 NOTE — Telephone Encounter (Signed)
Left message to call back  

## 2017-06-25 NOTE — Telephone Encounter (Signed)
-----   Message from Troy Sine, MD sent at 06/24/2017  4:07 PM EST ----- Hildred Alamin: Please notify patient that her study is normal.  There were no events.  She had normal sleep architecture.

## 2017-06-26 ENCOUNTER — Encounter: Payer: Self-pay | Admitting: Neurology

## 2017-06-26 MED ORDER — DICLOFENAC SODIUM 50 MG PO TBEC: DELAYED_RELEASE_TABLET | ORAL | 4 refills | Status: DC

## 2017-06-27 DIAGNOSIS — Z87820 Personal history of traumatic brain injury: Secondary | ICD-10-CM | POA: Diagnosis not present

## 2017-06-27 DIAGNOSIS — E349 Endocrine disorder, unspecified: Secondary | ICD-10-CM | POA: Diagnosis not present

## 2017-06-29 LAB — TESTOSTERONE, FREE: TESTOSTERONE FREE: 1.7 pg/mL (ref 0.0–4.2)

## 2017-06-29 LAB — INSULIN-LIKE GROWTH FACTOR: Insulin-Like GF-1: 218 ng/mL (ref 78–270)

## 2017-06-29 LAB — T4, FREE: FREE T4: 1.26 ng/dL (ref 0.82–1.77)

## 2017-06-29 LAB — CORTISOL-AM, BLOOD: CORTISOL - AM: 5.2 ug/dL — AB (ref 6.2–19.4)

## 2017-06-29 LAB — TSH: TSH: 1.28 u[IU]/mL (ref 0.450–4.500)

## 2017-06-29 LAB — T3, FREE: T3, Free: 3.1 pg/mL (ref 2.0–4.4)

## 2017-07-02 ENCOUNTER — Ambulatory Visit: Payer: BLUE CROSS/BLUE SHIELD | Admitting: Physician Assistant

## 2017-07-03 ENCOUNTER — Other Ambulatory Visit: Payer: Self-pay

## 2017-07-03 ENCOUNTER — Encounter: Payer: Self-pay | Admitting: Registered Nurse

## 2017-07-03 ENCOUNTER — Telehealth: Payer: Self-pay | Admitting: Cardiovascular Disease

## 2017-07-03 ENCOUNTER — Encounter: Payer: BLUE CROSS/BLUE SHIELD | Attending: Physical Medicine & Rehabilitation | Admitting: Registered Nurse

## 2017-07-03 VITALS — BP 86/58 | HR 76

## 2017-07-03 DIAGNOSIS — M797 Fibromyalgia: Secondary | ICD-10-CM

## 2017-07-03 DIAGNOSIS — J45909 Unspecified asthma, uncomplicated: Secondary | ICD-10-CM | POA: Insufficient documentation

## 2017-07-03 DIAGNOSIS — G43109 Migraine with aura, not intractable, without status migrainosus: Secondary | ICD-10-CM | POA: Diagnosis not present

## 2017-07-03 DIAGNOSIS — M549 Dorsalgia, unspecified: Secondary | ICD-10-CM | POA: Diagnosis not present

## 2017-07-03 DIAGNOSIS — G894 Chronic pain syndrome: Secondary | ICD-10-CM | POA: Diagnosis not present

## 2017-07-03 DIAGNOSIS — R0989 Other specified symptoms and signs involving the circulatory and respiratory systems: Secondary | ICD-10-CM | POA: Diagnosis not present

## 2017-07-03 DIAGNOSIS — M5416 Radiculopathy, lumbar region: Secondary | ICD-10-CM | POA: Diagnosis not present

## 2017-07-03 DIAGNOSIS — D497 Neoplasm of unspecified behavior of endocrine glands and other parts of nervous system: Secondary | ICD-10-CM | POA: Diagnosis not present

## 2017-07-03 DIAGNOSIS — G43909 Migraine, unspecified, not intractable, without status migrainosus: Secondary | ICD-10-CM | POA: Diagnosis not present

## 2017-07-03 DIAGNOSIS — Z5181 Encounter for therapeutic drug level monitoring: Secondary | ICD-10-CM | POA: Diagnosis not present

## 2017-07-03 DIAGNOSIS — Z813 Family history of other psychoactive substance abuse and dependence: Secondary | ICD-10-CM | POA: Insufficient documentation

## 2017-07-03 DIAGNOSIS — G8929 Other chronic pain: Secondary | ICD-10-CM | POA: Diagnosis present

## 2017-07-03 DIAGNOSIS — E669 Obesity, unspecified: Secondary | ICD-10-CM | POA: Diagnosis not present

## 2017-07-03 DIAGNOSIS — Z79899 Other long term (current) drug therapy: Secondary | ICD-10-CM | POA: Diagnosis not present

## 2017-07-03 DIAGNOSIS — G40909 Epilepsy, unspecified, not intractable, without status epilepticus: Secondary | ICD-10-CM | POA: Diagnosis not present

## 2017-07-03 DIAGNOSIS — F4310 Post-traumatic stress disorder, unspecified: Secondary | ICD-10-CM | POA: Diagnosis not present

## 2017-07-03 DIAGNOSIS — Z8249 Family history of ischemic heart disease and other diseases of the circulatory system: Secondary | ICD-10-CM | POA: Diagnosis not present

## 2017-07-03 DIAGNOSIS — Z87820 Personal history of traumatic brain injury: Secondary | ICD-10-CM | POA: Diagnosis not present

## 2017-07-03 MED ORDER — CYCLOBENZAPRINE HCL 10 MG PO TABS: 10.0000 mg | ORAL_TABLET | Freq: Three times a day (TID) | ORAL | 3 refills | Status: DC | PRN

## 2017-07-03 MED ORDER — FENTANYL 12 MCG/HR TD PT72: 12.5000 ug | MEDICATED_PATCH | TRANSDERMAL | 0 refills | Status: DC

## 2017-07-03 MED ORDER — OXYCODONE-ACETAMINOPHEN 5-325 MG PO TABS: 1.0000 | ORAL_TABLET | Freq: Three times a day (TID) | ORAL | 0 refills | Status: DC | PRN

## 2017-07-03 NOTE — Telephone Encounter (Signed)
Pt c/o BP issue: STAT if pt c/o blurred vision, one-sided weakness or slurred speech  1. What are your last 5 BP readings? 77/51  2. Are you having any other symptoms (ex. Dizziness, headache, blurred vision, passed out)? Dizziness and trouble staying awake  3. What is your BP issue? Running low

## 2017-07-03 NOTE — Telephone Encounter (Signed)
Returned the call to the husband, per the dpr. She stated that the patient has episodes of high blood pressure and then low blood pressures. Today she was 70/51 with a heart rate of 70. The patient is symptomatic with complaints of dizziness and trouble walking. It has been advised to go to the ED but they have refused.  The husband has been instructed to make sure the patient stays hydrated and rests today. Blood pressure on recheck, lying down, was 79/46. The patient took Metoprolol 25 mg this morning. She took her percocet this morning but no other pain medications. She has also taken 3 tablets of the 2.5 mg Midodrine. The 3 were taken by 11:30.   The patient again has been advised to go to the ED but refused. The husband will call 911 if she gets worse.
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
## 2017-07-04 ENCOUNTER — Other Ambulatory Visit: Payer: Self-pay | Admitting: Physician Assistant

## 2017-07-04 DIAGNOSIS — G479 Sleep disorder, unspecified: Secondary | ICD-10-CM

## 2017-07-04 NOTE — Telephone Encounter (Signed)
Refill request for Trazodone / LOV 03/05/17 with Juanda Crumble

## 2017-07-14 ENCOUNTER — Other Ambulatory Visit: Payer: Self-pay | Admitting: Physician Assistant

## 2017-07-16 ENCOUNTER — Encounter: Payer: Self-pay | Admitting: Physician Assistant

## 2017-07-18 DIAGNOSIS — F4310 Post-traumatic stress disorder, unspecified: Secondary | ICD-10-CM | POA: Diagnosis not present

## 2017-07-21 ENCOUNTER — Telehealth: Payer: Self-pay | Admitting: *Deleted

## 2017-07-21 NOTE — Telephone Encounter (Signed)
Oral swab drug screen was consistent for prescribed medications.  ?

## 2017-07-22 NOTE — Telephone Encounter (Signed)
Left message to call back  

## 2017-07-31 ENCOUNTER — Encounter: Payer: BLUE CROSS/BLUE SHIELD | Attending: Physical Medicine & Rehabilitation | Admitting: Registered Nurse

## 2017-07-31 ENCOUNTER — Other Ambulatory Visit: Payer: Self-pay | Admitting: Physician Assistant

## 2017-07-31 ENCOUNTER — Encounter: Payer: Self-pay | Admitting: Registered Nurse

## 2017-07-31 VITALS — BP 136/94 | HR 94 | Resp 14

## 2017-07-31 DIAGNOSIS — G43909 Migraine, unspecified, not intractable, without status migrainosus: Secondary | ICD-10-CM | POA: Insufficient documentation

## 2017-07-31 DIAGNOSIS — D497 Neoplasm of unspecified behavior of endocrine glands and other parts of nervous system: Secondary | ICD-10-CM | POA: Diagnosis not present

## 2017-07-31 DIAGNOSIS — M549 Dorsalgia, unspecified: Secondary | ICD-10-CM | POA: Insufficient documentation

## 2017-07-31 DIAGNOSIS — F4310 Post-traumatic stress disorder, unspecified: Secondary | ICD-10-CM | POA: Insufficient documentation

## 2017-07-31 DIAGNOSIS — G40909 Epilepsy, unspecified, not intractable, without status epilepticus: Secondary | ICD-10-CM | POA: Diagnosis not present

## 2017-07-31 DIAGNOSIS — Z813 Family history of other psychoactive substance abuse and dependence: Secondary | ICD-10-CM | POA: Diagnosis not present

## 2017-07-31 DIAGNOSIS — E669 Obesity, unspecified: Secondary | ICD-10-CM | POA: Diagnosis not present

## 2017-07-31 DIAGNOSIS — Z79899 Other long term (current) drug therapy: Secondary | ICD-10-CM | POA: Diagnosis not present

## 2017-07-31 DIAGNOSIS — J45909 Unspecified asthma, uncomplicated: Secondary | ICD-10-CM | POA: Diagnosis not present

## 2017-07-31 DIAGNOSIS — Z8249 Family history of ischemic heart disease and other diseases of the circulatory system: Secondary | ICD-10-CM | POA: Diagnosis not present

## 2017-07-31 DIAGNOSIS — G43109 Migraine with aura, not intractable, without status migrainosus: Secondary | ICD-10-CM | POA: Diagnosis not present

## 2017-07-31 DIAGNOSIS — R0989 Other specified symptoms and signs involving the circulatory and respiratory systems: Secondary | ICD-10-CM | POA: Insufficient documentation

## 2017-07-31 DIAGNOSIS — G479 Sleep disorder, unspecified: Secondary | ICD-10-CM

## 2017-07-31 DIAGNOSIS — G894 Chronic pain syndrome: Secondary | ICD-10-CM | POA: Insufficient documentation

## 2017-07-31 DIAGNOSIS — Z87820 Personal history of traumatic brain injury: Secondary | ICD-10-CM | POA: Diagnosis not present

## 2017-07-31 DIAGNOSIS — Z5181 Encounter for therapeutic drug level monitoring: Secondary | ICD-10-CM | POA: Diagnosis not present

## 2017-07-31 DIAGNOSIS — M797 Fibromyalgia: Secondary | ICD-10-CM

## 2017-07-31 DIAGNOSIS — M5416 Radiculopathy, lumbar region: Secondary | ICD-10-CM | POA: Diagnosis not present

## 2017-07-31 DIAGNOSIS — G8929 Other chronic pain: Secondary | ICD-10-CM | POA: Diagnosis present

## 2017-07-31 MED ORDER — OXYCODONE-ACETAMINOPHEN 5-325 MG PO TABS: 1.0000 | ORAL_TABLET | Freq: Three times a day (TID) | ORAL | 0 refills | Status: DC | PRN

## 2017-07-31 MED ORDER — AMITRIPTYLINE HCL 50 MG PO TABS: 50.0000 mg | ORAL_TABLET | Freq: Every day | ORAL | 2 refills | Status: DC

## 2017-07-31 MED ORDER — FENTANYL 12 MCG/HR TD PT72: 12.5000 ug | MEDICATED_PATCH | TRANSDERMAL | 0 refills | Status: DC
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
## 2017-07-31 NOTE — Patient Instructions (Addendum)
Amitriptyline: Take a half tablet at Bedtime for four nights if pain still persists Take one tablet at bedtime. Call Office in a week to evaluate medication change.   Tapentadol immediate-release oral tablets What is this medicine? TAPENTADOL (ta PEN ta dol) is a pain reliever. It is used to treat moderate to severe pain. This medicine may be used for other purposes; ask your health care provider or pharmacist if you have questions. COMMON BRAND NAME(S): Nucynta What should I tell my health care provider before I take this medicine? They need to know if you have any of these conditions: -gallbladder disease -head injury -history of a drug or alcohol abuse problem -if you often drink alcohol -kidney disease -liver disease -lung or breathing disease, like asthma -prostate disease -seizures -stomach or intestine problems -thyroid disease -an unusual or allergic reaction to tapentadol, other medicines, foods, dyes, or preservatives -pregnant or trying to get pregnant -breast-feeding How should I use this medicine? Take this medicine by mouth with a glass of water. If the medicine upsets your stomach, take it with food or milk. Follow the directions on the prescription label. Do not take more than you are told to take. A special MedGuide will be given to you by the pharmacist with each prescription and refill. Be sure to read this information carefully each time. Talk to your pediatrician regarding the use of this medicine in children. Special care may be needed. Overdosage: If you think you have taken too much of this medicine contact a poison control center or emergency room at once. NOTE: This medicine is only for you. Do not share this medicine with others. What if I miss a dose? If you miss a dose, take it as soon as you can. If it is almost time for your next dose, take only that dose. Do not take double or extra doses. What may interact with this medicine? Do not take this medicine  with any of the following medications: -MAOIs like Carbex, Eldepryl, Marplan, Nardil, and Parnate This medicine may also interact with the following medications: -alcohol -antihistamines for allergy, cough and cold -atropine -certain medicines for anxiety or sleep -certain medicines for bladder problems like oxybutynin, tolterodine -certain medicines for depression like amitriptyline, fluoxetine, sertraline -certain medicines for migraine headache like almotriptan, eletriptan, frovatriptan, naratriptan, rizatriptan, sumatriptan, zolmitriptan -certain medicines for Parkinson's disease like benztropine, trihexyphenidyl -certain medicines for seizures like phenobarbital, primidone -certain medicines for stomach problems like dicyclomine, hyoscyamine -certain medicines for travel sickness like scopolamine -general anesthetics like halothane, isoflurane, methoxyflurane, propofol -ipratropium -local anesthetics like lidocaine, pramoxine, tetracaine -medicines that relax muscles for surgery -other narcotic medicines for pain or cough -phenothiazines like chlorpromazine, mesoridazine, prochlorperazine, thioridazine This list may not describe all possible interactions. Give your health care provider a list of all the medicines, herbs, non-prescription drugs, or dietary supplements you use. Also tell them if you smoke, drink alcohol, or use illegal drugs. Some items may interact with your medicine. What should I watch for while using this medicine? Tell your doctor or health care professional if your pain does not go away, if it gets worse, or if you have new or a different type of pain. You may develop tolerance to the medicine. Tolerance means that you will need a higher dose of the medicine for pain relief. Tolerance is normal and is expected if you take the medicine for a long time. Do not suddenly stop taking your medicine because you may develop a severe reaction. Your body becomes used  to the  medicine. This does NOT mean you are addicted. Addiction is a behavior related to getting and using a drug for a non-medical reason. If you have pain, you have a medical reason to take pain medicine. Your doctor will tell you how much medicine to take. If your doctor wants you to stop the medicine, the dose will be slowly lowered over time to avoid any side effects. There are different types of narcotic medicines (opiates). If you take more than one type at the same time or if you are taking another medicine that also causes drowsiness, you may have more side effects. Give your health care provider a list of all medicines you use. Your doctor will tell you how much medicine to take. Do not take more medicine than directed. Call emergency for help if you have problems breathing or unusual sleepiness. You may get drowsy or dizzy. Do not drive, use machinery, or do anything that needs mental alertness until you know how this medicine affects you. Do not stand or sit up quickly, especially if you are an older patient. This reduces the risk of dizzy or fainting spells. Alcohol may interfere with the effect of this medicine. Avoid alcoholic drinks. This medicine will cause constipation. Try to have a bowel movement at least every 2 to 3 days. If you do not have a bowel movement for 3 days, call your doctor or health care professional. Your mouth may get dry. Chewing sugarless gum or sucking heard candy, and drinking plenty of water may help. Contact your doctor if the problem does not go away or is severe. What side effects may I notice from receiving this medicine? Side effects that you should report to your doctor or health care professional as soon as possible: -allergic reactions like skin rash, itching or hives, swelling of the face, lips, or tongue -breathing problems -confusion -seizures -signs and symptoms of low bleed pressure like dizziness; feeling faint or lightheaded, falls; unusually weak or  tired -trouble passing urine or change in the amount of urine Side effects that usually do not require medical attention (report to your doctor or health care professional if they continue or are bothersome): -constipation -dry mouth -nausea, vomiting -tiredness This list may not describe all possible side effects. Call your doctor for medical advice about side effects. You may report side effects to FDA at 1-800-FDA-1088. Where should I keep my medicine? Keep out of the reach of children. This medicine can be abused. Keep your medicine in a safe place to protect it from theft. Do not share this medicine with anyone. Selling or giving away this medicine is dangerous and against the law. Store at room temperature between 15 and 30 degrees C (59 and 86 degrees F). Protect from moisture. This medicine may cause accidental overdose and death if it is taken by other adults, children, or pets. Flush any unused medicine down the toilet to reduce the chance of harm. Do not use the medicine after the expiration date. NOTE: This sheet is a summary. It may not cover all possible information. If you have questions about this medicine, talk to your doctor, pharmacist, or health care provider.  2018 Elsevier/Gold Standard (2015-08-03 12:37:22)

## 2017-07-31 NOTE — Telephone Encounter (Signed)
Trazodone refill 

## 2017-08-05 NOTE — Telephone Encounter (Signed)
Patient is requesting a refill of the following medications: Requested Prescriptions   Pending Prescriptions Disp Refills  . traZODone (DESYREL) 100 MG tablet [Pharmacy Med Name: TRAZODONE HCL 100 MG TABLET] 60 tablet 1    Sig: TAKE 1 TABLET BY MOUTH AT BEDTIME AS NEEDED FOR SLEEP    Date of patient request: 07/31/17 Last office visit: 03/05/17 Date of last refill: 04/08/17 Last refill amount: #60 tabs, 1 refill Follow up time period per chart: Not specified, see 04/04/17 patient email.

## 2017-08-06 NOTE — Progress Notes (Signed)
Left message to call back 1/23-letter mailed to patient

## 2017-08-06 NOTE — Telephone Encounter (Signed)
Left message to call back.  Results mailed to patient.

## 2017-08-27 ENCOUNTER — Encounter: Payer: BLUE CROSS/BLUE SHIELD | Attending: Physical Medicine & Rehabilitation | Admitting: Registered Nurse

## 2017-08-27 ENCOUNTER — Telehealth: Payer: Self-pay | Admitting: Cardiovascular Disease

## 2017-08-27 ENCOUNTER — Encounter: Payer: Self-pay | Admitting: Registered Nurse

## 2017-08-27 VITALS — BP 120/80 | HR 88

## 2017-08-27 DIAGNOSIS — M797 Fibromyalgia: Secondary | ICD-10-CM | POA: Diagnosis not present

## 2017-08-27 DIAGNOSIS — R0989 Other specified symptoms and signs involving the circulatory and respiratory systems: Secondary | ICD-10-CM | POA: Diagnosis not present

## 2017-08-27 DIAGNOSIS — Z813 Family history of other psychoactive substance abuse and dependence: Secondary | ICD-10-CM | POA: Insufficient documentation

## 2017-08-27 DIAGNOSIS — G43109 Migraine with aura, not intractable, without status migrainosus: Secondary | ICD-10-CM

## 2017-08-27 DIAGNOSIS — Z5181 Encounter for therapeutic drug level monitoring: Secondary | ICD-10-CM

## 2017-08-27 DIAGNOSIS — G894 Chronic pain syndrome: Secondary | ICD-10-CM | POA: Diagnosis not present

## 2017-08-27 DIAGNOSIS — M5416 Radiculopathy, lumbar region: Secondary | ICD-10-CM

## 2017-08-27 DIAGNOSIS — G43909 Migraine, unspecified, not intractable, without status migrainosus: Secondary | ICD-10-CM | POA: Insufficient documentation

## 2017-08-27 DIAGNOSIS — G40909 Epilepsy, unspecified, not intractable, without status epilepticus: Secondary | ICD-10-CM | POA: Diagnosis not present

## 2017-08-27 DIAGNOSIS — Z8249 Family history of ischemic heart disease and other diseases of the circulatory system: Secondary | ICD-10-CM | POA: Diagnosis not present

## 2017-08-27 DIAGNOSIS — D497 Neoplasm of unspecified behavior of endocrine glands and other parts of nervous system: Secondary | ICD-10-CM | POA: Diagnosis not present

## 2017-08-27 DIAGNOSIS — E669 Obesity, unspecified: Secondary | ICD-10-CM | POA: Diagnosis not present

## 2017-08-27 DIAGNOSIS — J45909 Unspecified asthma, uncomplicated: Secondary | ICD-10-CM | POA: Insufficient documentation

## 2017-08-27 DIAGNOSIS — G8929 Other chronic pain: Secondary | ICD-10-CM | POA: Diagnosis present

## 2017-08-27 DIAGNOSIS — M549 Dorsalgia, unspecified: Secondary | ICD-10-CM | POA: Diagnosis not present

## 2017-08-27 DIAGNOSIS — Z87820 Personal history of traumatic brain injury: Secondary | ICD-10-CM | POA: Diagnosis not present

## 2017-08-27 DIAGNOSIS — F4310 Post-traumatic stress disorder, unspecified: Secondary | ICD-10-CM | POA: Insufficient documentation

## 2017-08-27 DIAGNOSIS — Z79899 Other long term (current) drug therapy: Secondary | ICD-10-CM | POA: Diagnosis not present

## 2017-08-27 MED ORDER — OXYCODONE-ACETAMINOPHEN 5-325 MG PO TABS: 1.0000 | ORAL_TABLET | Freq: Three times a day (TID) | ORAL | 0 refills | Status: DC | PRN
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
## 2017-08-27 NOTE — Telephone Encounter (Signed)
New Message    *STAT* If patient is at the pharmacy, call can be transferred to refill team.   1. Which medications need to be refilled? (please list name of each medication and dose if known) metoprolol tartrate (LOPRESSOR) 25 MG tablet(Expired)  2. Which pharmacy/location (including street and city if local pharmacy) is medication to be sent to? [facility]   3. Do they need a 30 day or 90 day supply? [place]

## 2017-08-28 MED ORDER — METOPROLOL TARTRATE 25 MG PO TABS: 25.0000 mg | ORAL_TABLET | Freq: Two times a day (BID) | ORAL | 3 refills | Status: DC

## 2017-08-29 ENCOUNTER — Telehealth: Payer: Self-pay | Admitting: *Deleted

## 2017-08-29 NOTE — Telephone Encounter (Signed)
Prior auth submitted to [employer] [place] for Nucynta 50 mg ER. Awaiting response

## 2017-09-01 ENCOUNTER — Telehealth: Payer: Self-pay | Admitting: Registered Nurse

## 2017-09-01 MED ORDER — TAPENTADOL HCL ER 50 MG PO TB12: 50.0000 mg | ORAL_TABLET | Freq: Two times a day (BID) | ORAL | 0 refills | Status: DC

## 2017-09-01 NOTE — Telephone Encounter (Signed)
Placed a call to Kristine Kelley to inform her regarding the approval of the Tapentadol ER, no answer. Left message to return the call, Fentanyl discontinued . Left message to use  Saving card. Also called [facility] regarding the above.

## 2017-09-02 LAB — TOXASSURE SELECT,+ANTIDEPR,UR

## 2017-09-04 ENCOUNTER — Ambulatory Visit: Payer: BLUE CROSS/BLUE SHIELD

## 2017-09-04 NOTE — Progress Notes (Deleted)
Patient ID: Kristine Kelley                 DOB: [birth year]                      MRN: [id number]       HPI: Kristine Kelley is a 27 y.o. female referred by Dr. Gwenlyn Found to HTN clinic. PMH includes PTSD, hx of epilepsy, pituitary tumor, migraines, neuropathy, chronic pain, asthma, and hypertension.  Currently undergoing assessment for obstructive sleep apnea and scheduled to follow up with neurologist in town. Her PCP is also adjusting migraine medication. Noted history of symptomatic hypotensive episodes with BP at 69/51 and 63/43. BP remains elevated most of the time with diastolic BP >40 and pulse up to 110 bpm. During most recent episode of hypotension, PCP reccommended visit to ED but patient refused due to cost and inability to pay for care. She used lisinopril only 8 times in the past 2 week as morning BP was above 130/90 in the morning and 9 doses of hydralazine in the last 2 weeks for evening readings >150/100.  Periods of hypotension are NOT associated with postprandial measurements or post-hydralazine dose intake.   Patient repost improvement on migraine headaches. Denies chest pain , shortness of breath , swelling or any other problems. We discussed the possibility of opioid induced hypotension and oversedation. She is to monitor BP, RR and heart rate daily. Symptoms associated with opiod overdose and need to keep naloxone available also discussed during the appointment.     Current HTN meds:  Lisinopril 65m daily Hydralazine 229mTID PRN for SBP >150 or DBP >100  BP goal: 130/80  Social History: Denies tobacco use, alcohol or drug use  Diet: cooking mainly at home; trying to work on low sodium choices  Exercise: walking 30-60 minutes 2-3x/week  Home BP readings:  24 readings: average 131/90; pulse 57-119 bpm  Wt Readings from Last 3 Encounters:  06/18/17 205 lb (93 kg)  06/03/17 200 lb (90.7 kg)  05/21/17 200 lb (90.7 kg)   BP Readings from Last 3 Encounters:  08/27/17 120/80   07/31/17 (!) 136/94  07/03/17 (!) 86/58   Pulse Readings from Last 3 Encounters:  08/27/17 88  07/31/17 94  07/03/17 76    Past Medical History:  Diagnosis Date  . Asthma   . Benign [place] of pituitary gland ([place]  . Chronic back pain   . Chronic pain   . Fibromyalgia   . Migraine   . PTSD (post-traumatic stress disorder)   . Seizures ([facility]  . [facility]    Current Outpatient Medications on File Prior to Visit  Medication Sig Dispense Refill  . albuterol (PROVENTIL HFA;VENTOLIN HFA) 108 (90 Base) MCG/ACT inhaler Inhale 2 puffs into the lungs every 4 (four) hours as needed for wheezing or shortness of breath (cough, shortness of breath or wheezing.). 1 Inhaler 2  . amitriptyline (ELAVIL) 50 MG tablet Take 1 tablet (50 mg total) by mouth at bedtime. 30 tablet 2  . butalbital-acetaminophen-caffeine (FIORICET, ESGIC) 50-325-40 MG tablet As directed  0  . cyclobenzaprine (FLEXERIL) 10 MG tablet Take 1 tablet (10 mg total) by mouth 3 (three) times daily as needed. 60 tablet 3  . desogestrel-ethinyl estradiol (APRI,EMOQUETTE,SOLIA) 0.15-30 MG-MCG tablet Take 1 tablet by mouth daily. 1 Package 11  . diclofenac (VOLTAREN) 50 MG EC tablet Take 1 tablet at onset of migraine. Do not take more than 2-3 a week  10 tablet 4  . Diclofenac Potassium (CAMBIA) 50 MG PACK Use 1 packet at onset of migraine. Do not use more than 3 times a week 9 each 5  . DULoxetine (CYMBALTA) 30 MG capsule Take 1 capsule (30 mg total) by mouth daily. Take with 61m for a total of 915mqd. 90 capsule 3  . DULoxetine (CYMBALTA) 60 MG capsule Take 1 capsule (60 mg total) by mouth daily. Take with 3060mapsule for a total of 31m26m. 90 capsule 3  . EPINEPHrine 0.15 MG/0.15ML IJ injection Inject 0.15 mLs (0.15 mg total) into the muscle as needed for anaphylaxis. 2 Device 0  . fluticasone (FLONASE) 50 MCG/ACT nasal spray Use 1 spray per nostril 1-2 times daily as needed 16 g 5  . fluticasone (FLOVENT  HFA) 110 MCG/ACT inhaler Inhale 2 puffs into the lungs 2 (two) times daily. During asthma flares. Use with spacer device. 1 Inhaler 5  . hydrALAZINE (APRESOLINE) 10 MG tablet Take 2 tablets (20 mg total) by mouth 3 (three) times daily as needed. 180 tablet 0  . ibuprofen (ADVIL,MOTRIN) 800 MG tablet Take 800 mg by mouth 2 (two) times daily as needed.    . lamoTRIgine (LAMICTAL) 100 MG tablet Take 1 tablet (100 mg total) daily by mouth. Take 1 tab in AM, 2 tabs in PM 90 tablet 6  . levocetirizine (XYZAL) 5 MG tablet Take 1 tablet (5 mg total) by mouth daily as needed for allergies. 30 tablet 5  . metoprolol tartrate (LOPRESSOR) 25 MG tablet Take 1 tablet (25 mg total) by mouth 2 (two) times daily. NEED OV. 60 tablet 3  . montelukast (SINGULAIR) 10 MG tablet Take 1 tablet (10 mg total) by mouth at bedtime. 30 tablet 5  . NARCAN 4 MG/0.1ML LIQD nasal spray kit As directed  0  . Olopatadine HCl (PAZEO) 0.7 % SOLN Place 1 drop into both eyes daily as needed. 1 Bottle 5  . ondansetron (ZOFRAN) 4 MG tablet Take 4 mg by mouth every 8 (eight) hours as needed for nausea or vomiting.    . ondansetron (ZOFRAN-ODT) 4 MG disintegrating tablet Take 1 tablet by mouth every 8 (eight) hours as needed.    . oxMarland KitchenCODONE-acetaminophen (PERCOCET/ROXICET) 5-325 MG tablet Take 1 tablet by mouth every 8 (eight) hours as needed for severe pain. May take an extra tablet when pain is severe only 10 days out of the month 100 tablet 0  . rizatriptan (MAXALT) 10 MG tablet As directed  0  . tapentadol (NUCYNTA) 50 MG 12 hr tablet Take 1 tablet (50 mg total) by mouth every 12 (twelve) hours. 60 tablet 0  . traZODone (DESYREL) 100 MG tablet TAKE 1 TABLET BY MOUTH AT BEDTIME AS NEEDED FOR SLEEP 60 tablet 1   No current facility-administered medications on file prior to visit.     Allergies  Allergen Reactions  . Ambien [Zolpidem Tartrate] Other (See Comments)    PTSD, nightmares   . Dilantin [Phenytoin]     Intravenously - it  burns   . Naproxen Hives    There were no vitals taken for this visit.  No problem-specific Assessment & Plan notes found for this encounter.  Raquel Rodriguez-Guzman PharmD, BCPS, CPP [id number] 8:43 AM

## 2017-09-05 ENCOUNTER — Telehealth: Payer: Self-pay | Admitting: *Deleted

## 2017-09-05 NOTE — Telephone Encounter (Signed)
Urine drug screen for this encounter is consistent for prescribed medication 

## 2017-09-09 ENCOUNTER — Telehealth: Payer: Self-pay | Admitting: Allergy

## 2017-09-09 ENCOUNTER — Other Ambulatory Visit: Payer: Self-pay | Admitting: Allergy

## 2017-09-09 NOTE — Telephone Encounter (Signed)
Olopatadine 0.02% daily as needed.  Thanks.

## 2017-09-09 NOTE — Telephone Encounter (Signed)
Dr. Verlin Fester. Insurance will not cover Pazeo after April 1st. They will cover Azelastine HCI 0.05%,Epinastine HCI0.05%,Olopatadine HCI0.1% or Olopatadine HCI 0.02% solution. Please Advise

## 2017-09-10 ENCOUNTER — Other Ambulatory Visit: Payer: Self-pay | Admitting: Allergy

## 2017-09-10 ENCOUNTER — Telehealth: Payer: Self-pay | Admitting: Allergy

## 2017-09-10 MED ORDER — OLOPATADINE HCL 0.2 % OP SOLN: OPHTHALMIC | 5 refills | Status: AC

## 2017-09-10 NOTE — Telephone Encounter (Signed)
Left message for patient to call office about we changed eye drops to Olopatadine 0.02% instead Pazeo.

## 2017-09-10 NOTE — Telephone Encounter (Signed)
Faxed in prescription and left message for patient to call office.

## 2017-09-11 ENCOUNTER — Telehealth: Payer: Self-pay | Admitting: *Deleted

## 2017-09-11 ENCOUNTER — Other Ambulatory Visit: Payer: Self-pay | Admitting: Physician Assistant

## 2017-09-11 DIAGNOSIS — G43119 Migraine with aura, intractable, without status migrainosus: Secondary | ICD-10-CM

## 2017-09-11 NOTE — Telephone Encounter (Signed)
Return Kristine Kelley, call, she reports the Nucynta is ineffective,  and she has been having difficulty with  Sleeping. She was prescribed Nucynta and has strted this a week ago. We discussed Nucynta in detail, she was encouraged to continue with the Nucynta since she's only been on it for a week, she verbalizes understanding. Reviewed all of her medication with her, she was instructed to call in a week to evaluate medication. She verbalizes understanding. Relating to her Insomnia she is on Trazodone, she verbalizes understanding.

## 2017-09-11 NOTE — Telephone Encounter (Signed)
patient is asking for Zella Ball to call back. Nucynta is not working. Pain level has spiked, difficult falling asleep, unable to function.

## 2017-09-12 NOTE — Telephone Encounter (Signed)
Lm for pt to call us back  

## 2017-09-12 NOTE — Telephone Encounter (Signed)
LOV: 05/03/17  PCP: [place]: [place]

## 2017-09-18 ENCOUNTER — Ambulatory Visit: Payer: Medicaid Other | Admitting: Allergy and Immunology

## 2017-09-22 ENCOUNTER — Encounter
Payer: BLUE CROSS/BLUE SHIELD | Attending: Physical Medicine & Rehabilitation | Admitting: Physical Medicine & Rehabilitation

## 2017-09-22 ENCOUNTER — Telehealth: Payer: Self-pay | Admitting: *Deleted

## 2017-09-22 ENCOUNTER — Encounter: Payer: Self-pay | Admitting: Physical Medicine & Rehabilitation

## 2017-09-22 VITALS — BP 139/103 | HR 120 | Resp 14

## 2017-09-22 DIAGNOSIS — M549 Dorsalgia, unspecified: Secondary | ICD-10-CM | POA: Insufficient documentation

## 2017-09-22 DIAGNOSIS — Z8249 Family history of ischemic heart disease and other diseases of the circulatory system: Secondary | ICD-10-CM | POA: Insufficient documentation

## 2017-09-22 DIAGNOSIS — G894 Chronic pain syndrome: Secondary | ICD-10-CM

## 2017-09-22 DIAGNOSIS — Z87820 Personal history of traumatic brain injury: Secondary | ICD-10-CM | POA: Diagnosis not present

## 2017-09-22 DIAGNOSIS — Z813 Family history of other psychoactive substance abuse and dependence: Secondary | ICD-10-CM | POA: Insufficient documentation

## 2017-09-22 DIAGNOSIS — D497 Neoplasm of unspecified behavior of endocrine glands and other parts of nervous system: Secondary | ICD-10-CM | POA: Insufficient documentation

## 2017-09-22 DIAGNOSIS — Z79899 Other long term (current) drug therapy: Secondary | ICD-10-CM | POA: Insufficient documentation

## 2017-09-22 DIAGNOSIS — G43909 Migraine, unspecified, not intractable, without status migrainosus: Secondary | ICD-10-CM | POA: Insufficient documentation

## 2017-09-22 DIAGNOSIS — E669 Obesity, unspecified: Secondary | ICD-10-CM | POA: Diagnosis not present

## 2017-09-22 DIAGNOSIS — M7918 Myalgia, other site: Secondary | ICD-10-CM | POA: Insufficient documentation

## 2017-09-22 DIAGNOSIS — G8929 Other chronic pain: Secondary | ICD-10-CM | POA: Diagnosis present

## 2017-09-22 DIAGNOSIS — F4310 Post-traumatic stress disorder, unspecified: Secondary | ICD-10-CM | POA: Diagnosis not present

## 2017-09-22 DIAGNOSIS — J45909 Unspecified asthma, uncomplicated: Secondary | ICD-10-CM | POA: Diagnosis not present

## 2017-09-22 DIAGNOSIS — G40909 Epilepsy, unspecified, not intractable, without status epilepticus: Secondary | ICD-10-CM | POA: Diagnosis not present

## 2017-09-22 DIAGNOSIS — M797 Fibromyalgia: Secondary | ICD-10-CM | POA: Diagnosis not present

## 2017-09-22 DIAGNOSIS — R0989 Other specified symptoms and signs involving the circulatory and respiratory systems: Secondary | ICD-10-CM | POA: Diagnosis not present

## 2017-09-22 MED ORDER — TAPENTADOL HCL ER 50 MG PO TB12: 50.0000 mg | ORAL_TABLET | Freq: Two times a day (BID) | ORAL | 0 refills | Status: DC

## 2017-09-22 MED ORDER — AMITRIPTYLINE HCL 75 MG PO TABS: 50.0000 mg | ORAL_TABLET | Freq: Every day | ORAL | 5 refills | Status: DC

## 2017-09-22 MED ORDER — OXYCODONE-ACETAMINOPHEN 5-325 MG PO TABS: 1.0000 | ORAL_TABLET | Freq: Three times a day (TID) | ORAL | 0 refills | Status: DC | PRN

## 2017-09-22 NOTE — Patient Instructions (Signed)
PLEASE FEEL FREE TO CALL OUR OFFICE WITH ANY PROBLEMS OR QUESTIONS ([phone])      

## 2017-09-22 NOTE — Telephone Encounter (Signed)
You're kidding right?  The prescription is for the ER!!! Why is there a question?

## 2017-09-22 NOTE — Progress Notes (Signed)
Subjective:    Patient ID: Kristine Kelley, female    DOB: 1990-11-12, 27 y.o.   MRN: [id number]  HPI   [name] is here in follow up of her chronic pain. I last saw her in November for her initial eval. My NP has been seeing her since. We started her on nucynta ER 50mg  bid last month in addition to the percocet. Prior to that she seemed to be experiencing some general improvement in her pain. Unfortunately, she has had a "rough" month with pain. She has had some increase in her pain particularly her left shoulder and low cervical "nerve" pain.  It has affected her sleep. I asked her what had changed over the last month, and she's not sure. She has been taking an on-line class thru [employer] for the last 1-2 months. She has noticed that she has more pain when she's concentrating for the courses. She is not taking rest breaks when she "attends" a class. [name] has found that her concentration is still lacking from her TBI and wonders whether or not it will get better.   The elavil seemed to be helping her sleep prior to this month. Currently she's on 50mg  at bedtime. However , with the up-tick in her pain, it hasn't been as effective. Her significant other tries some massage on her upper back pain (which they refer to as "nerve pain" which provides some relief. The pain is just inside the left scapula and more centrally along the spine with radiation outward.   She is trying to stay active with basic exercise,walking. She finds that she feels better when she is up and around.     Pain Inventory Average Pain 7 Pain Right Now 7 My pain is intermittent, burning, dull, tingling and aching  In the last 24 hours, has pain interfered with the following? General activity 5 Relation with others 6 Enjoyment of life 5 What TIME of day is your pain at its worst? morning, night Sleep (in general) Fair  Pain is worse with: walking, sitting, inactivity and standing Pain improves with: rest, heat/ice,  therapy/exercise and medication Relief from Meds: 5  Mobility walk without assistance how many minutes can you walk? 30 ability to climb steps?  yes do you drive?  yes Do you have any goals in this area?  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  yes  Neuro/Psych dizziness  Prior Studies Any changes since last visit?  no  Physicians involved in your care Any changes since last visit?  no   Family History  Problem Relation Age of Onset  . Heart disease Mother   . Cancer Mother   . Drug abuse Mother   . Heart disease Father   . Drug abuse Father   . Asthma Brother   . Allergic rhinitis Neg Hx   . Eczema Neg Hx   . Urticaria Neg Hx   . Immunodeficiency Neg Hx   . Angioedema Neg Hx    Social History   Socioeconomic History  . Marital status: Married    Spouse name: None  . Number of children: None  . Years of education: None  . Highest education level: None  Social Needs  . Financial resource strain: None  . Food insecurity - worry: None  . Food insecurity - inability: None  . Transportation needs -  medical: None  . Transportation needs - non-medical: None  Occupational History  . None  Tobacco Use  . Smoking status: Never Smoker  . Smokeless tobacco: Never Used  Substance and Sexual Activity  . Alcohol use: No  . Drug use: No  . Sexual activity: Yes    Birth control/protection: Pill  Other Topics Concern  . None  Social History Narrative   Pt lives in 1 story home with her husband   Has 1 step child who does not reside with pt   Some college   Nurse, children's.    Past Surgical History:  Procedure Laterality Date  . APPENDECTOMY    . KIDNEY STONE SURGERY     Past Medical History:  Diagnosis Date  . Asthma   . Benign tumor of pituitary gland ([place])   . Chronic back pain     . Chronic pain   . Fibromyalgia   . Migraine   . PTSD (post-traumatic stress disorder)   . Seizures ([place])   . Traumatic brain injury ([place])    BP (!) 139/103 (BP Location: Right Arm, Patient Position: Sitting, Cuff Size: Large)   Pulse (!) 120   Resp 14   SpO2 98%   Opioid Risk Score:   Fall Risk Score:  `1  Depression screen PHQ 2/9  Depression screen [facility] 2/9 07/03/2017 06/10/2017 06/10/2017 03/05/2017 01/06/2017 10/02/2016 08/22/2016  Decreased Interest 1 1 0 0 0 0 0  Down, Depressed, Hopeless 0 0 0 0 0 0 0  PHQ - 2 Score 1 1 0 0 0 0 0  Altered sleeping - 3 - - - - -  Tired, decreased energy - 3 - - - - -  Change in appetite - 2 - - - - -  Feeling bad or failure about yourself  - 1 - - - - -  Trouble concentrating - 0 - - - - -  Moving slowly or fidgety/restless - 1 - - - - -  Suicidal thoughts - 0 - - - - -  PHQ-9 Score - 11 - - - - -  Difficult doing work/chores - Somewhat difficult - - - - -    Review of Systems  Constitutional: Negative.   HENT: Negative.   Eyes: Negative.   Respiratory: Negative.   Cardiovascular: Negative.   Gastrointestinal: Negative.   Endocrine: Negative.   Genitourinary: Negative.   Musculoskeletal: Positive for back pain, neck pain and neck stiffness.  Skin: Negative.   Allergic/Immunologic: Negative.   Neurological: Positive for dizziness.  Hematological: Negative.   Psychiatric/Behavioral: Negative.   All other systems reviewed and are negative.      Objective:   Physical Exam   General: No acute distress. Obese HEENT: EOMI, oral membranes moist Cards: reg rate  Chest: normal effort Abdomen: Soft, NT, ND Skin: dry, intact Extremities: no edema  Skin: Clean and intact without signs of breakdown Neuro: cognitively she seems generally appropriate. No focal CN signs or motor/sensory deficits. Musculoskeletal: Patient has normal cervical and lumbar range of motion. Head forward posture. TP's left medial traps/rhomboids.  Scattered  tender points throughout trunk and extremities.  Gait was essentially normal.  Straight leg testing was negative. Psych: Pt's affect is appropriate. Pt is cooperative            1.  Chronic pain syndrome most consistent with fibromyalgia 2.  History of PTSD 3.  History of multiple brain traumas/concussions 4.  Chronic migraines 5.  Seizure disorder 6.  Chronic back pain 7.  Pituitary tumor 8.  Labile blood pressure and heart rate suggestive of autonomic dysfunction 9.  Recent weight gain/obesity  Plan: 1.  Reviewed the importance of aerobic exercise and maintaining (and expanding) her activity tolerance. She has insight as to why this important for her pain.  2.  For sleep and for pain, increase elavil to 75mg .  She may continue with trazodone at the current 100 mg nightly dose.   3. After informed consent and preparation of the skin with isopropyl alcohol, I injected the left trap (2 sites)  medially along high thoracic spine each with 2cc of 1% lidocaine. The patient tolerated well, and no complications were experienced. Post-injection instructions were provided. Reviewed importance of posture,mechanics and use of physical modalities for her pain.  4.  Seizure headache management per neurology.  Might benefit from a medication such as Depakote for migraine prophylaxis---still can consider but don't want to add to her medication burden.  5.  History and presentation is suspicious for POTS.  Cards follow up for labile BP.  6.  Refilled nucynta ER 50mg  and percocet today, #60, #100 respectively. We will continue the opioid monitoring program, this consists of regular clinic visits, examinations, routine drug screening, pill counts as well as use of [place] Controlled Substance Reporting System. [place] today.   7. Reviewed cognitive strategies today to help with her class work and concentration. Discussed the need for a quiet learning environment. She also will do  better with shorter increments of learning, perhaps 20-25 minutes at a time, between which she can take a short break to rest her eyes, stand and stretch, or whatever. It's not uncommon in patients with TBI's to struggle with mental stress such as school/work which ultimately can increase anxiety and pain levels once the brain becomes overwhelmed with information/input. It is important to keep challenging her brain as well, however.   Thirty minutes of face to face patient care time were spent during this visit. All questions were encouraged and answered. Follow up with me or NP in a month.

## 2017-09-22 NOTE — Telephone Encounter (Signed)
Contacted pharmacy and informed that 12 hour equals Extended Release (ER)

## 2017-09-22 NOTE — Telephone Encounter (Signed)
Kristine Kelley from [facility] called to question if the Nucynta Rx was supposed to be for ER or IR.  She has a history of being on ER so they need to verify.

## 2017-09-23 DIAGNOSIS — H5213 Myopia, bilateral: Secondary | ICD-10-CM | POA: Diagnosis not present

## 2017-09-30 ENCOUNTER — Other Ambulatory Visit: Payer: Self-pay

## 2017-09-30 ENCOUNTER — Ambulatory Visit: Payer: BLUE CROSS/BLUE SHIELD | Admitting: Physician Assistant

## 2017-09-30 ENCOUNTER — Encounter: Payer: Self-pay | Admitting: Physician Assistant

## 2017-09-30 VITALS — BP 128/88 | HR 111 | Temp 98.5°F | Resp 18 | Ht 62.0 in | Wt 205.8 lb

## 2017-09-30 DIAGNOSIS — G8929 Other chronic pain: Secondary | ICD-10-CM

## 2017-09-30 DIAGNOSIS — F4310 Post-traumatic stress disorder, unspecified: Secondary | ICD-10-CM | POA: Diagnosis not present

## 2017-09-30 DIAGNOSIS — Z23 Encounter for immunization: Secondary | ICD-10-CM | POA: Diagnosis not present

## 2017-09-30 DIAGNOSIS — R591 Generalized enlarged lymph nodes: Secondary | ICD-10-CM

## 2017-09-30 DIAGNOSIS — Z87898 Personal history of other specified conditions: Secondary | ICD-10-CM | POA: Diagnosis not present

## 2017-09-30 LAB — POCT CBC
Granulocyte percent: 59.2 %G (ref 37–80)
HCT, POC: 42.5 % (ref 37.7–47.9)
Hemoglobin: 14.5 g/dL (ref 12.2–16.2)
Lymph, poc: 2.3 (ref 0.6–3.4)
MCH, POC: 29.5 pg (ref 27–31.2)
MCHC: 34.2 g/dL (ref 31.8–35.4)
MCV: 86.3 fL (ref 80–97)
MID (cbc): 0.3 (ref 0–0.9)
MPV: 7.8 fL (ref 0–99.8)
POC Granulocyte: 3.8 (ref 2–6.9)
POC LYMPH PERCENT: 36 %L (ref 10–50)
POC MID %: 4.8 % (ref 0–12)
Platelet Count, POC: 266 10*3/uL (ref 142–424)
RBC: 4.93 M/uL (ref 4.04–5.48)
RDW, POC: 12.3 %
WBC: 6.4 10*3/uL (ref 4.6–10.2)

## 2017-09-30 MED ORDER — DULOXETINE HCL 30 MG PO CPEP: 90.0000 mg | ORAL_CAPSULE | Freq: Every day | ORAL | 3 refills | Status: DC

## 2017-09-30 NOTE — Patient Instructions (Addendum)
You will receive a phone call to schedule an appt with endocrinology.   Call [facility] for your routine PAP!  [address], [place], [place] [address] [phone]  Let me know how your lymph node is doing in the next 3-4 weeks.   DTaP Vaccine (Diphtheria, Tetanus, and Pertussis): What You Need to Know 1. Why get vaccinated? Diphtheria, tetanus, and pertussis are serious diseases caused by bacteria. Diphtheria and pertussis are spread from person to person. Tetanus enters the body through cuts or wounds. DIPHTHERIA causes a thick covering in the back of the throat.  It can lead to breathing problems, paralysis, heart failure, and even death.  TETANUS (Lockjaw) causes painful tightening of the muscles, usually all over the body.  It can lead to "locking" of the jaw so the victim cannot open his mouth or swallow. Tetanus leads to death in up to 2 out of 10 cases.  PERTUSSIS (Whooping Cough) causes coughing spells so bad that it is hard for infants to eat, drink, or breathe. These spells can last for weeks.  It can lead to pneumonia, seizures (jerking and staring spells), brain damage, and death.  Diphtheria, tetanus, and pertussis vaccine (DTaP) can help prevent these diseases. Most children who are vaccinated with DTaP will be protected throughout childhood. Many more children would get these diseases if we stopped vaccinating. DTaP is a safer version of an older vaccine called DTP. DTP is no longer used in the [place]. 2. Who should get DTaP vaccine and when? Children should get 5 doses of DTaP vaccine, one dose at each of the following ages:  2 months  4 months  6 months  15-18 months  4-6 years  DTaP may be given at the same time as other vaccines. 3. Some children should not get DTaP vaccine or should wait  Children with minor illnesses, such as a cold, may be vaccinated. But children who are moderately or severely ill should usually  wait until they recover before getting DTaP vaccine.  Any child who had a life-threatening allergic reaction after a dose of DTaP should not get another dose.  Any child who suffered a brain or nervous system disease within 7 days after a dose of DTaP should not get another dose.  Talk with your doctor if your child: ? had a seizure or collapsed after a dose of DTaP, ? cried non-stop for 3 hours or more after a dose of DTaP, ? had a fever over 105F after a dose of DTaP. Ask your doctor for more information. Some of these children should not get another dose of pertussis vaccine, but may get a vaccine without pertussis, called DT. 4. Older children and adults DTaP is not licensed for adolescents, adults, or children 72 years of age and older. But older people still need protection. A vaccine called Tdap is similar to DTaP. A single dose of Tdap is recommended for people 11 through 29 years of age. Another vaccine, called Td, protects against tetanus and diphtheria, but not pertussis. It is recommended every 10 years. There are separate Vaccine Information Statements for these vaccines. 5. What are the risks from DTaP vaccine? Getting diphtheria, tetanus, or pertussis disease is much riskier than getting DTaP vaccine. However, a vaccine, like any medicine, is capable of causing serious problems, such as severe allergic reactions. The risk of DTaP vaccine causing serious harm, or death, is extremely small. Mild problems (common)  Fever (up to about 1 child in  4)  Redness or swelling where the shot was given (up to about 1 child in 4)  Soreness or tenderness where the shot was given (up to about 1 child in 4) These problems occur more often after the 4th and 5th doses of the DTaP series than after earlier doses. Sometimes the 4th or 5th dose of DTaP vaccine is followed by swelling of the entire arm or leg in which the shot was given, lasting 1-7 days (up to about 1 child in 25). Other mild  problems include:  Fussiness (up to about 1 child in 3)  Tiredness or poor appetite (up to about 1 child in 10)  Vomiting (up to about 1 child in 32) These problems generally occur 1-3 days after the shot. Moderate problems (uncommon)  Seizure (jerking or staring) (about 1 child out of 14,000)  Non-stop crying, for 3 hours or more (up to about 1 child out of 1,000)  High fever, over 105F (about 1 child out of 16,000) Severe problems (very rare)  Serious allergic reaction (less than 1 out of a million doses)  Several other severe problems have been reported after DTaP vaccine. These include: ? Long-term seizures, coma, or lowered consciousness ? Permanent brain damage. These are so rare it is hard to tell if they are caused by the vaccine. Controlling fever is especially important for children who have had seizures, for any reason. It is also important if another family member has had seizures. You can reduce fever and pain by giving your child an aspirin-free pain reliever when the shot is given, and for the next 24 hours, following the package instructions. 6. What if there is a serious reaction? What should I look for? Look for anything that concerns you, such as signs of a severe allergic reaction, very high fever, or behavior changes. Signs of a severe allergic reaction can include hives, swelling of the face and throat, difficulty breathing, a fast heartbeat, dizziness, and weakness. These would start a few minutes to a few hours after the vaccination. What should I do?  If you think it is a severe allergic reaction or other emergency that can't wait, call 9-1-1 or get the person to the nearest hospital. Otherwise, call your doctor.  Afterward, the reaction should be reported to the Vaccine Adverse Event Reporting System (VAERS). Your doctor might file this report, or you can do it yourself through the VAERS web site at www.vaers.SamedayNews.es, or by calling (484) 839-0678. ? VAERS  is only for reporting reactions. They do not give medical advice. 7. The National Vaccine Injury Compensation Program The [employer] Vaccine Injury Compensation Program (VICP) is a federal program that was created to compensate people who may have been injured by certain vaccines. Persons who believe they may have been injured by a vaccine can learn about the program and about filing a claim by calling (318)607-0657 or visiting the [employer] website at GoldCloset.com.ee. 8. How can I learn more?  Ask your doctor.  Call your local or state health department.  Contact the Centers for Disease Control and Prevention (CDC): ? Call 754 292 5498 (1-800-CDC-INFO) or ? Visit CDC's website at http://hunter.com/ CDC DTaP Vaccine (Diphtheria, Tetanus, and Pertussis) VIS (11/28/05) This information is not intended to replace advice given to you by your health care provider. Make sure you discuss any questions you have with your health care provider. Document Released: 04/28/2006 Document Revised: 03/21/2016 Document Reviewed: 03/21/2016 Elsevier Interactive Patient Education  2017 [place] you for coming in  today. I hope you feel we met your needs.  Feel free to call PCP if you have any questions or further requests.  Please consider signing up for MyChart if you do not already have it, as this is a great way to communicate with me.  Best,  Whitney McVey, PA-C   IF you received an x-ray today, you will receive an invoice from [facility]. Please contact [facility] at [phone] with questions or concerns regarding your invoice.   IF you received labwork today, you will receive an invoice from [place]. Please contact [facility] at [phone] with questions or concerns regarding your invoice.   Our billing staff will not be able to assist you with questions regarding bills from these companies.  You will be contacted with the lab results as soon as they  are available. The fastest way to get your results is to activate your My Chart account. Instructions are located on the last page of this paperwork. If you have not heard from [place] regarding the results in 2 weeks, please contact this office.

## 2017-09-30 NOTE — Progress Notes (Signed)
Kristine Kelley  MRN: [id number] DOB: [birth year]  PCP: Dorise Hiss, PA-C  Subjective:  Pt is a 27 year old female who presents to clinic for lymph node swelling of left side x 1 week.   She noticed a swollen lymph node on the left side of her neck behind her jaw about 1 week ago.  It is not painful to touch, not red, no drainage.  She denies recent illness.  She denies of mouth pain, pain with chewing, pain with swallowing, difficulty swallowing.  Drainage from her mouth.  She has not taken anything to feel better.  Cymbalta - medication is more affordable if she takes 3 pills of the 46m.  She would like refills at this dose  She is requesting tdap today.   She is seeing pain doctor Dr. ZAlger Simonsand she loves him. He is managing her pain medications and is feeling very good.  Her pain doctor mentioned she should get an evaluation by Endocrinology for her pituitary tumor   Review of Systems  Constitutional: Negative for chills, diaphoresis, fatigue and fever.  HENT: Negative for congestion, postnasal drip, rhinorrhea, sinus pressure, sinus pain and sore throat.   Respiratory: Negative for cough, shortness of breath and wheezing.   Psychiatric/Behavioral: Negative for sleep disturbance.    Patient Active Problem List   Diagnosis Date Noted  . Myofascial pain 09/22/2017  . Perennial and seasonal allergic rhinitis 06/18/2017  . Oral allergy syndrome 06/18/2017  . Allergic conjunctivitis 06/18/2017  . History of traumatic brain injury 06/10/2017  . Labile blood pressure 06/10/2017  . Fibromyalgia 06/10/2017  . Hormonal disorder 06/10/2017  . Obstructive sleep apnea 01/03/2017  . Essential hypertension 12/11/2016  . Mild persistent asthma 11/12/2012  . DDD (degenerative disc disease), cervical 11/12/2012  . [place] ([place] 11/12/[place]  . Displacement of lumbar intervertebral disc without myelopathy 11/12/2012  . Migraine with aura 11/12/2012  . 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 Obesity 11/12/2012  . Other chronic pain 11/12/2012  . Tachycardia 11/12/2012  . Kidney stone 11/10/2012  . Posttraumatic stress disorder 11/10/2012  . Seizures ([place] 07/24/2011    Current Outpatient Medications on File Prior to Visit  Medication Sig Dispense Refill  . albuterol (PROVENTIL HFA;VENTOLIN HFA) 108 (90 Base) MCG/ACT inhaler Inhale 2 puffs into the lungs every 4 (four) hours as needed for wheezing or shortness of breath (cough, shortness of breath or wheezing.). 1 Inhaler 2  . amitriptyline (ELAVIL) 75 MG tablet Take 0.5 tablets (37.5 mg total) by mouth at bedtime. 30 tablet 5  . butalbital-acetaminophen-caffeine (FIORICET, ESGIC) 50-325-40 MG tablet As directed  0  . cyclobenzaprine (FLEXERIL) 10 MG tablet Take 1 tablet (10 mg total) by mouth 3 (three) times daily as needed. 60 tablet 3  . desogestrel-ethinyl estradiol (APRI,EMOQUETTE,SOLIA) 0.15-30 MG-MCG tablet Take 1 tablet by mouth daily. 1 Package 11  . diclofenac (VOLTAREN) 50 MG EC tablet Take 1 tablet at onset of migraine. Do not take more than 2-3 a week 10 tablet 4  . Diclofenac Potassium (CAMBIA) 50 MG PACK Use 1 packet at onset of migraine. Do not use more than 3 times a week 9 each 5  . DULoxetine (CYMBALTA) 30 MG capsule Take 1 capsule (30 mg total) by mouth daily. Take with 667mfor a total of 9073md. 90 capsule 3  . DULoxetine (CYMBALTA) 60 MG capsule Take 1 capsule (60 mg total) by mouth daily. Take with 33m55mpsule for  a total of 15m qd. 90 capsule 3  . EPINEPHrine 0.15 MG/0.15ML IJ injection Inject 0.15 mLs (0.15 mg total) into the muscle as needed for anaphylaxis. 2 Device 0  . fluticasone (FLONASE) 50 MCG/ACT nasal spray Use 1 spray per nostril 1-2 times daily as needed 16 g 5  . fluticasone (FLOVENT HFA) 110 MCG/ACT inhaler Inhale 2 puffs into the lungs 2 (two) times daily. During asthma flares. Use with spacer device. 1  Inhaler 5  . ibuprofen (ADVIL,MOTRIN) 800 MG tablet Take 800 mg by mouth 2 (two) times daily as needed.    . lamoTRIgine (LAMICTAL) 100 MG tablet Take 1 tablet (100 mg total) daily by mouth. Take 1 tab in AM, 2 tabs in PM 90 tablet 6  . levocetirizine (XYZAL) 5 MG tablet Take 1 tablet (5 mg total) by mouth daily as needed for allergies. 30 tablet 5  . metoprolol tartrate (LOPRESSOR) 25 MG tablet Take 1 tablet (25 mg total) by mouth 2 (two) times daily. NEED OV. 60 tablet 3  . montelukast (SINGULAIR) 10 MG tablet Take 1 tablet (10 mg total) by mouth at bedtime. 30 tablet 5  . NARCAN 4 MG/0.1ML LIQD nasal spray kit As directed  0  . Olopatadine HCl (PAZEO) 0.7 % SOLN Place 1 drop into both eyes daily as needed. 1 Bottle 5  . Olopatadine HCl 0.2 % SOLN Use one drop each eye once daily. 2.5 mL 5  . ondansetron (ZOFRAN) 4 MG tablet Take 4 mg by mouth every 8 (eight) hours as needed for nausea or vomiting.    . ondansetron (ZOFRAN-ODT) 4 MG disintegrating tablet Take 1 tablet by mouth every 8 (eight) hours as needed.    .Marland KitchenoxyCODONE-acetaminophen (PERCOCET/ROXICET) 5-325 MG tablet Take 1 tablet by mouth every 8 (eight) hours as needed for severe pain. May take an extra tablet when pain is severe only 10 days out of the month 100 tablet 0  . rizatriptan (MAXALT) 10 MG tablet As directed  0  . rizatriptan (MAXALT) 10 MG tablet Take 1 tablet by mouth as needed for migraine. May repeat in 2 hours if needed. Max dose 35mday 30 tablet 1  . tapentadol (NUCYNTA) 50 MG 12 hr tablet Take 1 tablet (50 mg total) by mouth every 12 (twelve) hours. 60 tablet 0  . traZODone (DESYREL) 100 MG tablet TAKE 1 TABLET BY MOUTH AT BEDTIME AS NEEDED FOR SLEEP 60 tablet 1  . hydrALAZINE (APRESOLINE) 10 MG tablet Take 2 tablets (20 mg total) by mouth 3 (three) times daily as needed. 180 tablet 0   No current facility-administered medications on file prior to visit.     Allergies  Allergen Reactions  . Ambien [Zolpidem  Tartrate] Other (See Comments)    PTSD, nightmares   . Dilantin [Phenytoin]     Intravenously - it burns   . Naproxen Hives     Objective:  BP 128/88 (BP Location: Left Arm, Patient Position: Sitting, Cuff Size: Large)   Pulse (!) 111   Temp 98.5 F (36.9 C) (Oral)   Resp 18   Ht 5' 2" (1.575 m)   Wt 205 lb 12.8 oz (93.4 kg)   LMP 09/16/2017   SpO2 98%   BMI 37.64 kg/m   Physical Exam  Constitutional: She is oriented to person, place, and time and well-developed, well-nourished, and in no distress. No distress.  Cardiovascular: Normal rate, regular rhythm and normal heart sounds.  Lymphadenopathy:       Head (right side):  No submental, no submandibular, no tonsillar and no occipital adenopathy present.       Head (left side): Tonsillar adenopathy present. No submental, no submandibular and no occipital adenopathy present.    She has cervical adenopathy.       Right cervical: No deep cervical adenopathy present.      Left cervical: Deep cervical adenopathy present.       Right: No supraclavicular adenopathy present.       Left: No supraclavicular adenopathy present.  Neurological: She is alert and oriented to person, place, and time. GCS score is 15.  Skin: Skin is warm and dry.  Psychiatric: Mood, memory, affect and judgment normal.  Vitals reviewed.  Results for orders placed or performed in visit on 09/30/17  POCT CBC  Result Value Ref Range   WBC 6.4 4.6 - 10.2 K/uL   Lymph, poc 2.3 0.6 - 3.4   POC LYMPH PERCENT 36.0 10 - 50 %L   MID (cbc) 0.3 0 - 0.9   POC MID % 4.8 0 - 12 %M   POC Granulocyte 3.8 2 - 6.9   Granulocyte percent 59.2 37 - 80 %G   RBC 4.93 4.04 - 5.48 M/uL   Hemoglobin 14.5 12.2 - 16.2 g/dL   HCT, POC 42.5 37.7 - 47.9 %   MCV 86.3 80 - 97 fL   MCH, POC 29.5 27 - 31.2 pg   MCHC 34.2 31.8 - 35.4 g/dL   RDW, POC 12.3 %   Platelet Count, POC 266 142 - 424 K/uL   MPV 7.8 0 - 99.8 fL    Assessment and Plan :  1. Lymphadenopathy - POCT  CBC -Patient complains of swollen lymph node behind left jaw.  She has no other symptoms.  No concerning findings on physical exam today.  Will watch this for the next 2-3 weeks.  Consider antibiotic therapy if no improvement. 2. Need for diphtheria-tetanus-pertussis (Tdap) vaccine - Tdap vaccine greater than or equal to 7yo IM  3. Other chronic pain 4. Posttraumatic stress disorder - DULoxetine (CYMBALTA) 30 MG capsule; Take 3 capsules (90 mg total) by mouth daily.  Dispense: 90 capsule; Refill: 3  5. H/O: pituitary tumor - Ambulatory referral to Endocrinology -Patient endorses history of pituitary tumor which was diagnosed in [place].  She has never been evaluated for this in [place].  She would like a referral to endocrinology.  Advised patient she should request prior medical records.  Mercer Pod, PA-C  [facility] [place] 09/30/2017 10:59 AM

## 2017-10-01 ENCOUNTER — Other Ambulatory Visit: Payer: Self-pay | Admitting: Registered Nurse

## 2017-10-06 ENCOUNTER — Telehealth: Payer: Self-pay | Admitting: *Deleted

## 2017-10-06 DIAGNOSIS — Z87820 Personal history of traumatic brain injury: Secondary | ICD-10-CM

## 2017-10-06 DIAGNOSIS — F4310 Post-traumatic stress disorder, unspecified: Secondary | ICD-10-CM

## 2017-10-06 DIAGNOSIS — M797 Fibromyalgia: Secondary | ICD-10-CM

## 2017-10-06 DIAGNOSIS — G894 Chronic pain syndrome: Secondary | ICD-10-CM

## 2017-10-06 MED ORDER — AMITRIPTYLINE HCL 75 MG PO TABS: 75.0000 mg | ORAL_TABLET | Freq: Every day | ORAL | 5 refills | Status: DC

## 2017-10-06 NOTE — Telephone Encounter (Signed)
Juliann Pulse from [facility] called and said Kristine Kelley called in for refill on Amitriptyline 50 mg but they have her on 75mg .  I looked at Dr Naaman Plummer note and it says increase to 75 mg at hs.  In looking at the order that was place it said 75 mg  Take 0.5.mg q hs, by those directions it would be decreasing her dose.  His note said increase to 75mg ..  I have corrected the Rx  and sent to pharmacy.

## 2017-10-17 ENCOUNTER — Ambulatory Visit: Payer: BLUE CROSS/BLUE SHIELD | Admitting: Cardiovascular Disease

## 2017-10-17 ENCOUNTER — Telehealth: Payer: Self-pay | Admitting: Cardiovascular Disease

## 2017-10-17 ENCOUNTER — Encounter: Payer: Self-pay | Admitting: Cardiovascular Disease

## 2017-10-17 VITALS — BP 132/116 | HR 84 | Ht 62.0 in | Wt 205.4 lb

## 2017-10-17 DIAGNOSIS — I10 Essential (primary) hypertension: Secondary | ICD-10-CM | POA: Diagnosis not present

## 2017-10-17 DIAGNOSIS — R000 Tachycardia, unspecified: Secondary | ICD-10-CM | POA: Diagnosis not present

## 2017-10-17 DIAGNOSIS — R0602 Shortness of breath: Secondary | ICD-10-CM

## 2017-10-17 MED ORDER — METOPROLOL TARTRATE 50 MG PO TABS: 50.0000 mg | ORAL_TABLET | Freq: Two times a day (BID) | ORAL | 3 refills | Status: DC

## 2017-10-17 NOTE — Telephone Encounter (Signed)
STAT if HR is under 50 or over 120 (normal HR is 60-100 beats per minute)  1) What is your heart rate? 130  2) Do you have a log of your heart rate readings (document readings)?  Do you have any other symptoms? Dizziness and SOB, SOB has been going on for days.

## 2017-10-17 NOTE — Patient Instructions (Signed)
Medication Instructions: Your physician recommends that you continue on your current medications as directed. Please refer to the Current Medication list given to you today.  Increase Metoprolol to 50 mg twice daily   Testing/Procedures: Your physician has recommended that you wear a 30 day event monitor. Event monitors are medical devices that record the heart's electrical activity. Doctors most often [place] these monitors to diagnose arrhythmias. Arrhythmias are problems with the speed or rhythm of the heartbeat. The monitor is a small, portable device. You can wear one while you do your normal daily activities. This is usually used to diagnose what is causing palpitations/syncope (passing out).  Your physician has requested that you have an echocardiogram. Echocardiography is a painless test that uses sound waves to create images of your heart. It provides your doctor with information about the size and shape of your heart and how well your heart's chambers and valves are working. This procedure takes approximately one hour. There are no restrictions for this procedure.  Follow-Up: We request that you follow-up in: 3 months with an extender and in 12 months with Dr Andria Rhein will receive a reminder letter in the mail two months in advance. If you don't receive a letter, please call our office to schedule the follow-up appointment.  If you need a refill on your cardiac medications before your next appointment, please call your pharmacy.

## 2017-10-17 NOTE — Telephone Encounter (Signed)
This is a duplicate encounter.

## 2017-10-17 NOTE — Telephone Encounter (Signed)
approved

## 2017-10-17 NOTE — Assessment & Plan Note (Signed)
History of "tachycardia" with tachypalpitations. I am going to obtain a 30 day event monitor and increase her metoprolol from 25-50 mg by mouth twice a day.

## 2017-10-17 NOTE — Telephone Encounter (Signed)
LMTCB Patient has MD OV on 10/21/17

## 2017-10-17 NOTE — Progress Notes (Signed)
10/17/2017 Kristine Kelley   12-24-1990  [id number]  Primary Physician McVey, Gelene Mink, PA-C Primary Cardiologist: Lorretta Harp MD Kristine Kelley, [place]  HPI:  Kristine Kelley is a 27 y.o.  mildly overweight married Caucasian female comes in by her husband Kristine Kelley . She has a service Dog because of PTSD. I last saw her in the office 01/03/17.She has a history of epilepsy, pituitary tumor, migraines, peripheral neuropathy, asthma, PTSD and recently recognized hypertension. She currently is on lisinopril. She does not smoke and drinks socially. Her weight has increased steadily over the last 5 years approximately 30 pounds. She does relate symptoms compatible with obstructive sleep apnea. Since I saw her she has remained relatively stable. She does say that she's recently had some worrisome tachycardia palpitations and elevated blood pressure.      Current Meds  Medication Sig  . albuterol (PROVENTIL HFA;VENTOLIN HFA) 108 (90 Base) MCG/ACT inhaler Inhale 2 puffs into the lungs every 4 (four) hours as needed for wheezing or shortness of breath (cough, shortness of breath or wheezing.).  Marland Kitchen amitriptyline (ELAVIL) 75 MG tablet Take 1 tablet (75 mg total) by mouth at bedtime.  . butalbital-acetaminophen-caffeine (FIORICET, ESGIC) 50-325-40 MG tablet As directed  . cyclobenzaprine (FLEXERIL) 10 MG tablet Take 1 tablet (10 mg total) by mouth 3 (three) times daily as needed.  . desogestrel-ethinyl estradiol (APRI,EMOQUETTE,SOLIA) 0.15-30 MG-MCG tablet Take 1 tablet by mouth daily.  . diclofenac (VOLTAREN) 50 MG EC tablet Take 1 tablet at onset of migraine. Do not take more than 2-3 a week  . Diclofenac Potassium (CAMBIA) 50 MG PACK Use 1 packet at onset of migraine. Do not use more than 3 times a week  . DULoxetine (CYMBALTA) 30 MG capsule Take 3 capsules (90 mg total) by mouth daily.  . DULoxetine (CYMBALTA) 60 MG capsule Take 1 capsule (60 mg total) by mouth daily. Take with 58m capsule  for a total of 99mqd.  . Marland KitchenPINEPHrine 0.15 MG/0.15ML IJ injection Inject 0.15 mLs (0.15 mg total) into the muscle as needed for anaphylaxis.  . fluticasone (FLONASE) 50 MCG/ACT nasal spray Use 1 spray per nostril 1-2 times daily as needed  . fluticasone (FLOVENT HFA) 110 MCG/ACT inhaler Inhale 2 puffs into the lungs 2 (two) times daily. During asthma flares. Use with spacer device.  . hydrALAZINE (APRESOLINE) 10 MG tablet Take 10 mg by mouth as directed.  . Marland Kitchenbuprofen (ADVIL,MOTRIN) 800 MG tablet Take 800 mg by mouth 2 (two) times daily as needed.  . lamoTRIgine (LAMICTAL) 100 MG tablet Take 1 tablet (100 mg total) daily by mouth. Take 1 tab in AM, 2 tabs in PM  . levocetirizine (XYZAL) 5 MG tablet Take 1 tablet (5 mg total) by mouth daily as needed for allergies.  . metoprolol tartrate (LOPRESSOR) 25 MG tablet Take 1 tablet (25 mg total) by mouth 2 (two) times daily. NEED OV.  . montelukast (SINGULAIR) 10 MG tablet Take 1 tablet (10 mg total) by mouth at bedtime.  . Marland KitchenARCAN 4 MG/0.1ML LIQD nasal spray kit As directed  . Olopatadine HCl (PAZEO) 0.7 % SOLN Place 1 drop into both eyes daily as needed.  . Olopatadine HCl 0.2 % SOLN Use one drop each eye once daily.  . ondansetron (ZOFRAN) 4 MG tablet Take 4 mg by mouth every 8 (eight) hours as needed for nausea or vomiting.  . ondansetron (ZOFRAN-ODT) 4 MG disintegrating tablet Take 1 tablet by mouth every 8 (eight) hours as needed.  .Marland Kitchen  oxyCODONE-acetaminophen (PERCOCET/ROXICET) 5-325 MG tablet Take 1 tablet by mouth every 8 (eight) hours as needed for severe pain. May take an extra tablet when pain is severe only 10 days out of the month  . rizatriptan (MAXALT) 10 MG tablet As directed  . rizatriptan (MAXALT) 10 MG tablet Take 1 tablet by mouth as needed for migraine. May repeat in 2 hours if needed. Max dose 22m/day  . tapentadol (NUCYNTA) 50 MG 12 hr tablet Take 1 tablet (50 mg total) by mouth every 12 (twelve) hours.  . traZODone (DESYREL) 100 MG  tablet TAKE 1 TABLET BY MOUTH AT BEDTIME AS NEEDED FOR SLEEP     Allergies  Allergen Reactions  . Ambien [Zolpidem Tartrate] Other (See Comments)    PTSD, nightmares   . Dilantin [Phenytoin]     Intravenously - it burns   . Naproxen Hives    Social History   Socioeconomic History  . Marital status: Married    Spouse name: Not on file  . Number of children: Not on file  . Years of education: Not on file  . Highest education level: Not on file  Occupational History  . Not on file  Social Needs  . Financial resource strain: Not on file  . Food insecurity:    Worry: Not on file    Inability: Not on file  . Transportation needs:    Medical: Not on file    Non-medical: Not on file  Tobacco Use  . Smoking status: Never Smoker  . Smokeless tobacco: Never Used  Substance and Sexual Activity  . Alcohol use: No  . Drug use: No  . Sexual activity: Yes    Birth control/protection: Pill  Lifestyle  . Physical activity:    Days per week: Not on file    Minutes per session: Not on file  . Stress: Not on file  Relationships  . Social connections:    Talks on phone: Not on file    Gets together: Not on file    Attends religious service: Not on file    Active member of club or organization: Not on file    Attends meetings of clubs or organizations: Not on file    Relationship status: Not on file  . Intimate partner violence:    Fear of current or ex partner: Not on file    Emotionally abused: Not on file    Physically abused: Not on file    Forced sexual activity: Not on file  Other Topics Concern  . Not on file  Social History Narrative   Pt lives in 1 story home with her husband   Has 1 step child who does not reside with pt   Some college   PNurse, children's      Review of Systems: General: negative for chills, fever, night sweats or weight changes.  Cardiovascular: negative for chest pain, dyspnea on exertion, edema, orthopnea,  palpitations, paroxysmal nocturnal dyspnea or shortness of breath Dermatological: negative for rash Respiratory: negative for cough or wheezing Urologic: negative for hematuria Abdominal: negative for nausea, vomiting, diarrhea, bright red blood per rectum, melena, or hematemesis Neurologic: negative for visual changes, syncope, or dizziness All other systems reviewed and are otherwise negative except as noted above.    Blood pressure (!) 132/116, pulse 84, height '5\' 2"'  (1.575 m), weight 205 lb 6.4 oz (93.2 kg).  General appearance: alert and no distress Neck: no adenopathy, no carotid bruit, no JVD, supple, symmetrical, trachea midline  and thyroid not enlarged, symmetric, no tenderness/mass/nodules Lungs: clear to auscultation bilaterally Heart: regular rate and rhythm, S1, S2 normal, no murmur, click, rub or gallop Extremities: extremities normal, atraumatic, no cyanosis or edema Pulses: 2+ and symmetric Skin: Skin color, texture, turgor normal. No rashes or lesions Neurologic: Alert and oriented X 3, normal strength and tone. Normal symmetric reflexes. Normal coordination and gait  EKG normal sinus rhythm at 84 without ST or T-wave changes. I personally reviewed this EKG  ASSESSMENT AND PLAN:   Essential hypertension History of essential hypertension with blood pressure measures 132/116. She is on metoprolol, hydralazine. She does complain of creasing tachypalpitations. She also complains of episodic hypotension. I am going to increase her metoprolol however.  Tachycardia History of "tachycardia" with tachypalpitations. I am going to obtain a 30 day event monitor and increase her metoprolol from 25-50 mg by mouth twice a day.      Lorretta Harp MD FACP,FACC,FAHA, [facility] 10/17/2017 11:16 AM

## 2017-10-17 NOTE — Assessment & Plan Note (Signed)
History of essential hypertension with blood pressure measures 132/116. She is on metoprolol, hydralazine. She does complain of creasing tachypalpitations. She also complains of episodic hypotension. I am going to increase her metoprolol however.

## 2017-10-17 NOTE — Telephone Encounter (Signed)
New Message ° ° ° °Pt is returning call  °

## 2017-10-17 NOTE — Addendum Note (Signed)
Addended by: Debria Garret L on: 10/17/2017 02:00 PM   Modules accepted: Orders

## 2017-10-17 NOTE — Telephone Encounter (Signed)
Returned call to patient of Dr. Gwenlyn Found who reports HR was 130 @ 815am (right after she took her medications) and then 134bpm (after meds). Her BP is fine. Patient does report she has issues with her HR going up. She feels a lot worse than usual. She does not feel like her heart is in an abnormal rhythm. She feels SOB and like there is pressure on her chest.   Patient originally had an appt scheduled for 4/9 but r/s to today 4/5 @ 10:45am.  Dr. Kennon Holter assist Stacy aware that patient has appt & will need EKG

## 2017-10-21 ENCOUNTER — Encounter: Payer: Self-pay | Admitting: Registered Nurse

## 2017-10-21 ENCOUNTER — Encounter: Payer: BLUE CROSS/BLUE SHIELD | Attending: Physical Medicine & Rehabilitation | Admitting: Registered Nurse

## 2017-10-21 ENCOUNTER — Ambulatory Visit: Payer: BLUE CROSS/BLUE SHIELD | Admitting: Cardiovascular Disease

## 2017-10-21 VITALS — BP 131/96 | HR 76 | Resp 14 | Ht 61.0 in | Wt 205.0 lb

## 2017-10-21 DIAGNOSIS — F4310 Post-traumatic stress disorder, unspecified: Secondary | ICD-10-CM | POA: Diagnosis not present

## 2017-10-21 DIAGNOSIS — G40909 Epilepsy, unspecified, not intractable, without status epilepticus: Secondary | ICD-10-CM | POA: Insufficient documentation

## 2017-10-21 DIAGNOSIS — M797 Fibromyalgia: Secondary | ICD-10-CM

## 2017-10-21 DIAGNOSIS — Z8249 Family history of ischemic heart disease and other diseases of the circulatory system: Secondary | ICD-10-CM | POA: Diagnosis not present

## 2017-10-21 DIAGNOSIS — Z813 Family history of other psychoactive substance abuse and dependence: Secondary | ICD-10-CM | POA: Diagnosis not present

## 2017-10-21 DIAGNOSIS — M549 Dorsalgia, unspecified: Secondary | ICD-10-CM | POA: Diagnosis not present

## 2017-10-21 DIAGNOSIS — J45909 Unspecified asthma, uncomplicated: Secondary | ICD-10-CM | POA: Diagnosis not present

## 2017-10-21 DIAGNOSIS — R0989 Other specified symptoms and signs involving the circulatory and respiratory systems: Secondary | ICD-10-CM | POA: Diagnosis not present

## 2017-10-21 DIAGNOSIS — G43909 Migraine, unspecified, not intractable, without status migrainosus: Secondary | ICD-10-CM | POA: Insufficient documentation

## 2017-10-21 DIAGNOSIS — D497 Neoplasm of unspecified behavior of endocrine glands and other parts of nervous system: Secondary | ICD-10-CM | POA: Insufficient documentation

## 2017-10-21 DIAGNOSIS — G8929 Other chronic pain: Secondary | ICD-10-CM | POA: Diagnosis present

## 2017-10-21 DIAGNOSIS — Z87820 Personal history of traumatic brain injury: Secondary | ICD-10-CM | POA: Diagnosis not present

## 2017-10-21 DIAGNOSIS — Z79899 Other long term (current) drug therapy: Secondary | ICD-10-CM | POA: Diagnosis not present

## 2017-10-21 DIAGNOSIS — E669 Obesity, unspecified: Secondary | ICD-10-CM | POA: Diagnosis not present

## 2017-10-21 DIAGNOSIS — M7918 Myalgia, other site: Secondary | ICD-10-CM | POA: Diagnosis not present

## 2017-10-21 DIAGNOSIS — G894 Chronic pain syndrome: Secondary | ICD-10-CM

## 2017-10-21 DIAGNOSIS — Z5181 Encounter for therapeutic drug level monitoring: Secondary | ICD-10-CM

## 2017-10-21 MED ORDER — OXYCODONE-ACETAMINOPHEN 5-325 MG PO TABS: 1.0000 | ORAL_TABLET | Freq: Three times a day (TID) | ORAL | 0 refills | Status: DC | PRN

## 2017-10-21 MED ORDER — TAPENTADOL HCL ER 50 MG PO TB12: 50.0000 mg | ORAL_TABLET | Freq: Two times a day (BID) | ORAL | 0 refills | Status: DC

## 2017-10-21 NOTE — Progress Notes (Signed)
Subjective:    Patient ID: Kristine Kelley, female    DOB: 12-09-1990, 27 y.o.   MRN: [id number]  HPI: Kristine Kelley is a 27 year old female who returns for follow up appointment and medication refill. She states her pain is located in her mid-lower back radiating into her bilateral lower extremities and bilateral feet. She rates her pain 5. Her current exercise regime is walking.   Kristine Kelley Morphine equivalent is 66.67 MME, she forgot her medication today. PMP Aware Web-site Reviewed. Educated on the [employer] and she verbalizes understanding.    Pain Inventory Average Pain 6 Pain Right Now 5 My pain is intermittent, sharp, burning, dull and aching  In the last 24 hours, has pain interfered with the following? General activity 5 Relation with others 5 Enjoyment of life 5 What TIME of day is your pain at its worst? morning, night  Sleep (in general) Fair  Pain is worse with: walking, sitting, inactivity, standing and some activites Pain improves with: rest, heat/ice, therapy/exercise and medication Relief from Meds: 7  Mobility walk without assistance how many minutes can you walk? 30 ability to climb steps?  yes do you drive?  yes Do you have any goals in this area?  yes  Function employed # of hrs/week .  Neuro/Psych No problems in this area  Prior Studies Any changes since last visit?  no  Physicians involved in your care Any changes since last visit?  no   Family History  Problem Relation Age of Onset  . Heart disease Mother   . Cancer Mother   . Drug abuse Mother   . Heart disease Father   . Drug abuse Father   . Asthma Brother   . Allergic rhinitis Neg Hx   . Eczema Neg Hx   . Urticaria Neg Hx   . Immunodeficiency Neg Hx   . Angioedema Neg Hx    Social History   Socioeconomic History  . Marital status: Married    Spouse name: Not on file  . Number of children: Not on file  . Years of education: Not on file  . Highest education level:  Not on file  Occupational History  . Not on file  Social Needs  . Financial resource strain: Not on file  . Food insecurity:    Worry: Not on file    Inability: Not on file  . Transportation needs:    Medical: Not on file    Non-medical: Not on file  Tobacco Use  . Smoking status: Never Smoker  . Smokeless tobacco: Never Used  Substance and Sexual Activity  . Alcohol use: No  . Drug use: No  . Sexual activity: Yes    Birth control/protection: Pill  Lifestyle  . Physical activity:    Days per week: Not on file    Minutes per session: Not on file  . Stress: Not on file  Relationships  . Social connections:    Talks on phone: Not on file    Gets together: Not on file    Attends religious service: Not on file    Active member of club or organization: Not on file    Attends meetings of clubs or organizations: Not on file    Relationship status: Not on file  Other Topics Concern  . Not on file  Social History Narrative   Pt lives in 1 story home with her husband   Has 1 step child who does not reside with pt  Some Teaching laboratory technician.    Past Surgical History:  Procedure Laterality Date  . APPENDECTOMY    . KIDNEY STONE SURGERY     Past Medical History:  Diagnosis Date  . Asthma   . Benign tumor of pituitary gland ([place])   . Chronic back pain   . Chronic pain   . Fibromyalgia   . Migraine   . PTSD (post-traumatic stress disorder)   . Seizures ([place])   . Traumatic brain injury ([place])    BP (!) 149/110 (BP Location: Right Arm, Patient Position: Sitting, Cuff Size: Normal)   Pulse 76   Resp 14   Ht 5\' 1"  (1.549 m)   Wt 205 lb (93 kg)   SpO2 96%   BMI 38.73 kg/m   Opioid Risk Score:   Fall Risk Score:  `1  Depression screen PHQ 2/9  Depression screen [facility] 2/9 09/30/2017 07/03/2017 06/10/2017 06/10/2017 03/05/2017 01/06/2017 10/02/2016  Decreased Interest 0 1 1 0 0 0 0  Down, Depressed, Hopeless 0 0 0 0 0 0 0  PHQ - 2 Score 0 1 1  0 0 0 0  Altered sleeping - - 3 - - - -  Tired, decreased energy - - 3 - - - -  Change in appetite - - 2 - - - -  Feeling bad or failure about yourself  - - 1 - - - -  Trouble concentrating - - 0 - - - -  Moving slowly or fidgety/restless - - 1 - - - -  Suicidal thoughts - - 0 - - - -  PHQ-9 Score - - 11 - - - -  Difficult doing work/chores - - Somewhat difficult - - - -    Review of Systems  Constitutional: Positive for diaphoresis and unexpected weight change.  HENT: Negative.   Eyes: Negative.   Respiratory: Negative.   Cardiovascular: Negative.   Gastrointestinal: Negative.   Endocrine: Negative.   Genitourinary: Negative.   Musculoskeletal: Positive for arthralgias, back pain, myalgias and neck pain.  Skin: Negative.   Allergic/Immunologic: Negative.   Neurological: Negative.   Hematological: Negative.   Psychiatric/Behavioral: Negative.        Objective:   Physical Exam  Constitutional: She is oriented to person, place, and time. She appears well-developed and well-nourished.  HENT:  Head: Normocephalic and atraumatic.  Neck: Normal range of motion. Neck supple.  Cardiovascular: Normal rate and regular rhythm.  Pulmonary/Chest: Effort normal and breath sounds normal.  Musculoskeletal:  Normal Muscle Bulk and Muscle Testing Reveals: Upper Extremities: Full ROM and Muscle Strength 5/5 Thoracic Paraspinal Tenderness: T-7-T-9 Lumbar Paraspinal Tenderness: L-3-L-5 Lower Extremities: Full ROM and Muscle Strength 5/5 Arises from Table with ease Narrow Based gait  Neurological: She is alert and oriented to person, place, and time.  Skin: Skin is warm and dry.  Psychiatric: She has a normal mood and affect.  Nursing note and vitals reviewed.         Assessment & Plan:  1. Chronic Pain Syndrome: Continue current medication regimen:  Refilled: Tapentadol 50 mg ER Q 12 hours #60 and Oxycodone 5/325 mg one tablet every 8 hours as needed #100. 10/21/2017 2.  Fibromyalgia: Continue HEP as Tolerated and continue current medication regimen with amitriptyline.  10/21/2017 3. History of PTSD: Continue Counseling/ Awaiting Psychiatry appointment. 10/21/2017 4. History TBI: Continue to Monitor. 10/21/2017 5. Migraine with Aura: Continue Fiorcet . 10/21/2017 6. Labile Blood Pressure: Blood Pressure Stable Today: Cardiology Following.  10/21/2017 7. ? POTS: Cardiology Following.10/21/2017 9. Lumbar Radiculitis: Continue current medication regimen with : Elavil.Marland Kitchen 10/21/2017 10. Muscle Spasm: Continue current medication regimen with Flexeril. 10/21/2017 11. Depression: Continue Cymbalta: PCP Following. Awaiting Psychiatric appointment. 10/21/2017.  44minutes of face to face patient care time was spent during this visit. All questions were encouraged and answered.

## 2017-10-22 ENCOUNTER — Telehealth: Payer: Self-pay

## 2017-10-22 NOTE — Telephone Encounter (Signed)
Patient called with a medication question for amitriptyline.  Called her back and was told by patient that the issue has been resolved and no question remains.

## 2017-10-27 ENCOUNTER — Other Ambulatory Visit: Payer: Self-pay | Admitting: Physician Assistant

## 2017-10-27 DIAGNOSIS — G479 Sleep disorder, unspecified: Secondary | ICD-10-CM

## 2017-10-27 NOTE — Telephone Encounter (Signed)
Refill of Desyrel  LOV 09/30/17  W.McVey  [facility] [place] - [place], [place] - [address]

## 2017-10-29 ENCOUNTER — Ambulatory Visit (INDEPENDENT_AMBULATORY_CARE_PROVIDER_SITE_OTHER): Payer: BLUE CROSS/BLUE SHIELD

## 2017-10-29 ENCOUNTER — Other Ambulatory Visit: Payer: Self-pay

## 2017-10-29 ENCOUNTER — Ambulatory Visit (HOSPITAL_COMMUNITY): Payer: BLUE CROSS/BLUE SHIELD | Attending: Cardiology

## 2017-10-29 DIAGNOSIS — R000 Tachycardia, unspecified: Secondary | ICD-10-CM | POA: Diagnosis not present

## 2017-10-29 DIAGNOSIS — R0602 Shortness of breath: Secondary | ICD-10-CM | POA: Insufficient documentation

## 2017-10-29 MED ORDER — PERFLUTREN LIPID MICROSPHERE
1.0000 mL | INTRAVENOUS | Status: AC | PRN
  Administered 2017-10-29: 1 mL via INTRAVENOUS

## 2017-10-30 ENCOUNTER — Ambulatory Visit: Payer: BLUE CROSS/BLUE SHIELD

## 2017-10-31 ENCOUNTER — Other Ambulatory Visit: Payer: Self-pay | Admitting: *Deleted

## 2017-10-31 ENCOUNTER — Telehealth: Payer: Self-pay | Admitting: *Deleted

## 2017-10-31 NOTE — Telephone Encounter (Signed)
Pt requesting refill forTrazodone (Desyrel) 100 mg  Last filled on 08/06/17.

## 2017-11-04 ENCOUNTER — Other Ambulatory Visit: Payer: Self-pay | Admitting: Physician Assistant

## 2017-11-04 DIAGNOSIS — G479 Sleep disorder, unspecified: Secondary | ICD-10-CM

## 2017-11-04 MED ORDER — TRAZODONE HCL 100 MG PO TABS: 100.0000 mg | ORAL_TABLET | Freq: Every evening | ORAL | 1 refills | Status: DC | PRN

## 2017-11-12 ENCOUNTER — Encounter: Payer: Self-pay | Admitting: Endocrinology

## 2017-11-19 ENCOUNTER — Telehealth: Payer: Self-pay | Admitting: Cardiovascular Disease

## 2017-11-19 ENCOUNTER — Encounter: Payer: BLUE CROSS/BLUE SHIELD | Attending: Physical Medicine & Rehabilitation | Admitting: Registered Nurse

## 2017-11-19 ENCOUNTER — Encounter: Payer: Self-pay | Admitting: Registered Nurse

## 2017-11-19 ENCOUNTER — Other Ambulatory Visit: Payer: Self-pay

## 2017-11-19 VITALS — BP 134/97 | HR 120 | Ht 62.0 in | Wt 206.0 lb

## 2017-11-19 DIAGNOSIS — Z79899 Other long term (current) drug therapy: Secondary | ICD-10-CM | POA: Diagnosis not present

## 2017-11-19 DIAGNOSIS — G40909 Epilepsy, unspecified, not intractable, without status epilepticus: Secondary | ICD-10-CM | POA: Insufficient documentation

## 2017-11-19 DIAGNOSIS — Z813 Family history of other psychoactive substance abuse and dependence: Secondary | ICD-10-CM | POA: Insufficient documentation

## 2017-11-19 DIAGNOSIS — F4310 Post-traumatic stress disorder, unspecified: Secondary | ICD-10-CM

## 2017-11-19 DIAGNOSIS — M549 Dorsalgia, unspecified: Secondary | ICD-10-CM | POA: Insufficient documentation

## 2017-11-19 DIAGNOSIS — J45909 Unspecified asthma, uncomplicated: Secondary | ICD-10-CM | POA: Insufficient documentation

## 2017-11-19 DIAGNOSIS — R000 Tachycardia, unspecified: Secondary | ICD-10-CM

## 2017-11-19 DIAGNOSIS — G43109 Migraine with aura, not intractable, without status migrainosus: Secondary | ICD-10-CM

## 2017-11-19 DIAGNOSIS — Z87820 Personal history of traumatic brain injury: Secondary | ICD-10-CM | POA: Insufficient documentation

## 2017-11-19 DIAGNOSIS — M797 Fibromyalgia: Secondary | ICD-10-CM | POA: Diagnosis not present

## 2017-11-19 DIAGNOSIS — G43909 Migraine, unspecified, not intractable, without status migrainosus: Secondary | ICD-10-CM | POA: Diagnosis not present

## 2017-11-19 DIAGNOSIS — D497 Neoplasm of unspecified behavior of endocrine glands and other parts of nervous system: Secondary | ICD-10-CM | POA: Diagnosis not present

## 2017-11-19 DIAGNOSIS — M5416 Radiculopathy, lumbar region: Secondary | ICD-10-CM | POA: Diagnosis not present

## 2017-11-19 DIAGNOSIS — G894 Chronic pain syndrome: Secondary | ICD-10-CM | POA: Diagnosis not present

## 2017-11-19 DIAGNOSIS — E669 Obesity, unspecified: Secondary | ICD-10-CM | POA: Diagnosis not present

## 2017-11-19 DIAGNOSIS — Z8249 Family history of ischemic heart disease and other diseases of the circulatory system: Secondary | ICD-10-CM | POA: Diagnosis not present

## 2017-11-19 DIAGNOSIS — M7918 Myalgia, other site: Secondary | ICD-10-CM | POA: Diagnosis not present

## 2017-11-19 DIAGNOSIS — R0989 Other specified symptoms and signs involving the circulatory and respiratory systems: Secondary | ICD-10-CM | POA: Insufficient documentation

## 2017-11-19 DIAGNOSIS — Z5181 Encounter for therapeutic drug level monitoring: Secondary | ICD-10-CM | POA: Diagnosis not present

## 2017-11-19 DIAGNOSIS — G8929 Other chronic pain: Secondary | ICD-10-CM | POA: Diagnosis present

## 2017-11-19 MED ORDER — OXYCODONE-ACETAMINOPHEN 5-325 MG PO TABS: 1.0000 | ORAL_TABLET | Freq: Three times a day (TID) | ORAL | 0 refills | Status: DC | PRN

## 2017-11-19 MED ORDER — TAPENTADOL HCL ER 50 MG PO TB12: 50.0000 mg | ORAL_TABLET | Freq: Two times a day (BID) | ORAL | 0 refills | Status: DC

## 2017-11-19 NOTE — Progress Notes (Signed)
Subjective:    Patient ID: Kristine Kelley, female    DOB: 21-Dec-1990, 27 y.o.   MRN: [id number]  HPI: Kristine Kelley is a 27 year old female who returns for follow up appointment for chronic pain and medication refill. She states her pain is located in her mid-lower back pain radiating into her lower extremities. She rates her pain 5. Her current exercise regime is walking. She arrived to office tachycardic, apical pulse obtained. Her husband was on the phone with cardiologist her heart rate has been elevated for a few days he states. She's wearing a Holter Monitor, she was instructed by Dr Kristine Kelley nurse to go to the emergency room, Mr. And Mrs Kristine Kelley stated they probably won't go to the Ed to be evaluated, encouraged to follow Dr. Gwenlyn Kelley recommendations. She is prescribed Cymbalta and her doe was increased, she will be calling PCP she reports.   [name] Morphine Equivalent is 69.33 MME.  Last  UDS was Performed on 08/27/2017, it was consistent.   Pain Inventory Average Pain 6 Pain Right Now 5 My pain is intermittent, burning, dull, stabbing and aching  In the last 24 hours, has pain interfered with the following? General activity 4 Relation with others 4 Enjoyment of life 4 What TIME of day is your pain at its worst? morning night Sleep (in general) NA  Pain is worse with: walking, sitting, inactivity and standing Pain improves with: rest, heat/ice, therapy/exercise and medication Relief from Meds: 7  Mobility walk without assistance how many minutes can you walk? 30 ability to climb steps?  yes do you drive?  yes Do you have any goals in this area?  yes  Function employed # of hrs/week 5  Neuro/Psych tingling dizziness  Prior Studies Any changes since last visit?  no  Physicians involved in your care Any changes since last visit?  no   Family History  Problem Relation Age of Onset  . Heart disease Mother   . Cancer Mother   . Drug abuse Mother   . Heart disease  Father   . Drug abuse Father   . Asthma Brother   . Allergic rhinitis Neg Hx   . Eczema Neg Hx   . Urticaria Neg Hx   . Immunodeficiency Neg Hx   . Angioedema Neg Hx    Social History   Socioeconomic History  . Marital status: Married    Spouse name: Not on file  . Number of children: Not on file  . Years of education: Not on file  . Highest education level: Not on file  Occupational History  . Not on file  Social Needs  . Financial resource strain: Not on file  . Food insecurity:    Worry: Not on file    Inability: Not on file  . Transportation needs:    Medical: Not on file    Non-medical: Not on file  Tobacco Use  . Smoking status: Never Smoker  . Smokeless tobacco: Never Used  Substance and Sexual Activity  . Alcohol use: No  . Drug use: No  . Sexual activity: Yes    Birth control/protection: Pill  Lifestyle  . Physical activity:    Days per week: Not on file    Minutes per session: Not on file  . Stress: Not on file  Relationships  . Social connections:    Talks on phone: Not on file    Gets together: Not on file    Attends religious service: Not on  file    Active member of club or organization: Not on file    Attends meetings of clubs or organizations: Not on file    Relationship status: Not on file  Other Topics Concern  . Not on file  Social History Narrative   Pt lives in 1 story home with her husband   Has 1 step child who does not reside with pt   Some college   Nurse, children's.    Past Surgical History:  Procedure Laterality Date  . APPENDECTOMY    . KIDNEY STONE SURGERY     Past Medical History:  Diagnosis Date  . Asthma   . Benign tumor of pituitary gland ([place])   . Chronic back pain   . Chronic pain   . Fibromyalgia   . Migraine   . PTSD (post-traumatic stress disorder)   . Seizures ([place])   . Traumatic brain injury ([place])    BP (!) 134/97   Pulse (!) 142 Comment: seeing cardiologist for elevated heart  rate  Ht 5\' 2"  (1.575 m) Comment: pt reported  Wt 206 lb (93.4 kg)   SpO2 98%   BMI 37.68 kg/m   Opioid Risk Score:   Fall Risk Score:  `1  Depression screen PHQ 2/9  Depression screen [facility] 2/9 11/19/2017 09/30/2017 07/03/2017 06/10/2017 06/10/2017 03/05/2017 01/06/2017  Decreased Interest 0 0 1 1 0 0 0  Down, Depressed, Hopeless 0 0 0 0 0 0 0  PHQ - 2 Score 0 0 1 1 0 0 0  Altered sleeping - - - 3 - - -  Tired, decreased energy - - - 3 - - -  Change in appetite - - - 2 - - -  Feeling bad or failure about yourself  - - - 1 - - -  Trouble concentrating - - - 0 - - -  Moving slowly or fidgety/restless - - - 1 - - -  Suicidal thoughts - - - 0 - - -  PHQ-9 Score - - - 11 - - -  Difficult doing work/chores - - - Somewhat difficult - - -     Review of Systems  Constitutional: Positive for unexpected weight change.  HENT: Negative.   Eyes: Negative.   Respiratory: Negative.   Cardiovascular: Negative.   Gastrointestinal: Negative.   Endocrine: Negative.   Genitourinary: Negative.   Musculoskeletal: Negative.   Skin: Negative.   Allergic/Immunologic: Negative.   Neurological: Negative.   Hematological: Negative.   Psychiatric/Behavioral: Negative.   All other systems reviewed and are negative.      Objective:   Physical Exam  Constitutional: She is oriented to person, place, and time. She appears well-developed and well-nourished.  HENT:  Head: Normocephalic and atraumatic.  Neck: Normal range of motion. Neck supple.  Cardiovascular:  Tachycardic   Pulmonary/Chest: Effort normal and breath sounds normal.  Musculoskeletal:  Normal Muscle Bulk and Muscle Testing Reveals: Upper Extremities: Full ROM and Muscle Strength 5/5 Thoracic Paraspinal Tenderness: T-1-T-2 Mainly Right Side T-7-T-9 Lumbar Paraspinal Tenderness:L-3-L-5 Lower Extremities: Full ROM and Muscle Strength 5/5 Arises from Table with Ease Narrow Based gait  Neurological: She is alert and oriented to  person, place, and time.  Skin: Skin is warm and dry.  Psychiatric: She has a normal mood and affect.  Nursing note and vitals reviewed.         Assessment & Plan:  1. Chronic Pain Syndrome:Continue current medication regimen:  Refilled: Tapentadol 50 mg ER Q 12  hours #60 andOxycodone 5/325 mg one tablet every 8 hours as needed #100.11/19/2017 2. Fibromyalgia:Continue HEP as Tolerated and continue current medication regimen with amitriptyline.  11/19/2017 3. History of PTSD: Continue Counseling/ Awaiting Psychiatry appointment. 11/19/2017 4. History TBI: Continue to Monitor. 11/19/2017 5. Migraine with Aura: Continue Fiorcet . 11/19/2017 6. Labile Blood Pressure: Blood Pressure Stable Today: Cardiology Following. 11/19/2017 7. ? POTS: Cardiology Following.11/19/2017 9. Lumbar Radiculitis: Continue current medication regimen with : Elavil.Marland Kitchen 11/19/2017 10. Muscle Spasm: Continue current medication regimen with Flexeril. 11/19/2017 11. Depression: Continue Cymbalta: PCP Following. Awaiting Psychiatric appointment. 11/19/2017. 12. Tachycardic: Mr. Mccleery spoke with Cardiologist: He was instructed to have Ms. Lamoreaux to go to ER for evaluation. Apical Pulse checked. Mr. Mena states they may not go to the Ed for Evlaution since her HR decreased in the 120's. Encouraged to follow Dr. Gwenlyn Kelley orders.  She was instructed to call PCP to decrease Cymbalta, she verbalizes understanding. Ms. Stlouis voiced concerns about decreasing Cymbalta. We discuss a slow wean, she verbalizes understanding.   66minutes of face to face patient care time was spent during this visit. All questions were encouraged and answered.

## 2017-11-19 NOTE — Telephone Encounter (Signed)
Pt c/o Shortness Of Breath: STAT if SOB developed within the last 24 hours or pt is noticeably SOB on the phone  1. Are you currently SOB (can you hear that pt is SOB on the phone)?  Pt husband on phone   2. How long have you been experiencing SOB?  Today   3. Are you SOB when sitting or when up moving around?  Comes and goes   1. 4. Are you currently experiencing any other symptoms? Dizzy/ HR 135-145/ BP 134/97

## 2017-11-19 NOTE — Telephone Encounter (Signed)
Spoke with Pt husband who states Pt is having c/o SOB, dizzy, and palpations. She is currently at the pain clinic and HR is between 145-132 BP 134/97. He reports has taking her metoprolol 50 mg this morning but HR continues to stay high. Per Dr. Gwenlyn Found but is to go to the ED for further evaluations. Husband verbalized understanding.

## 2017-11-27 ENCOUNTER — Other Ambulatory Visit: Payer: Self-pay | Admitting: Allergy and Immunology

## 2017-11-27 DIAGNOSIS — J3089 Other allergic rhinitis: Secondary | ICD-10-CM

## 2017-11-28 ENCOUNTER — Ambulatory Visit: Payer: BLUE CROSS/BLUE SHIELD | Admitting: Neurology

## 2017-12-04 ENCOUNTER — Other Ambulatory Visit: Payer: Self-pay | Admitting: Neurology

## 2017-12-19 ENCOUNTER — Encounter: Payer: BLUE CROSS/BLUE SHIELD | Attending: Physical Medicine & Rehabilitation | Admitting: Registered Nurse

## 2017-12-19 ENCOUNTER — Encounter: Payer: Self-pay | Admitting: Registered Nurse

## 2017-12-19 ENCOUNTER — Other Ambulatory Visit: Payer: Self-pay | Admitting: Physician Assistant

## 2017-12-19 VITALS — BP 132/84 | HR 109 | Ht 62.0 in | Wt 207.0 lb

## 2017-12-19 DIAGNOSIS — Z813 Family history of other psychoactive substance abuse and dependence: Secondary | ICD-10-CM | POA: Diagnosis not present

## 2017-12-19 DIAGNOSIS — G43909 Migraine, unspecified, not intractable, without status migrainosus: Secondary | ICD-10-CM | POA: Diagnosis not present

## 2017-12-19 DIAGNOSIS — M7918 Myalgia, other site: Secondary | ICD-10-CM

## 2017-12-19 DIAGNOSIS — G894 Chronic pain syndrome: Secondary | ICD-10-CM

## 2017-12-19 DIAGNOSIS — Z79899 Other long term (current) drug therapy: Secondary | ICD-10-CM | POA: Diagnosis not present

## 2017-12-19 DIAGNOSIS — M797 Fibromyalgia: Secondary | ICD-10-CM | POA: Insufficient documentation

## 2017-12-19 DIAGNOSIS — G8929 Other chronic pain: Secondary | ICD-10-CM | POA: Diagnosis present

## 2017-12-19 DIAGNOSIS — M549 Dorsalgia, unspecified: Secondary | ICD-10-CM | POA: Diagnosis not present

## 2017-12-19 DIAGNOSIS — D497 Neoplasm of unspecified behavior of endocrine glands and other parts of nervous system: Secondary | ICD-10-CM | POA: Diagnosis not present

## 2017-12-19 DIAGNOSIS — G40909 Epilepsy, unspecified, not intractable, without status epilepticus: Secondary | ICD-10-CM | POA: Diagnosis not present

## 2017-12-19 DIAGNOSIS — J45909 Unspecified asthma, uncomplicated: Secondary | ICD-10-CM | POA: Insufficient documentation

## 2017-12-19 DIAGNOSIS — E669 Obesity, unspecified: Secondary | ICD-10-CM | POA: Insufficient documentation

## 2017-12-19 DIAGNOSIS — F4310 Post-traumatic stress disorder, unspecified: Secondary | ICD-10-CM

## 2017-12-19 DIAGNOSIS — Z87820 Personal history of traumatic brain injury: Secondary | ICD-10-CM | POA: Insufficient documentation

## 2017-12-19 DIAGNOSIS — Z30019 Encounter for initial prescription of contraceptives, unspecified: Secondary | ICD-10-CM

## 2017-12-19 DIAGNOSIS — R0989 Other specified symptoms and signs involving the circulatory and respiratory systems: Secondary | ICD-10-CM | POA: Diagnosis not present

## 2017-12-19 DIAGNOSIS — M546 Pain in thoracic spine: Secondary | ICD-10-CM

## 2017-12-19 DIAGNOSIS — Z8249 Family history of ischemic heart disease and other diseases of the circulatory system: Secondary | ICD-10-CM | POA: Insufficient documentation

## 2017-12-19 DIAGNOSIS — Z5181 Encounter for therapeutic drug level monitoring: Secondary | ICD-10-CM

## 2017-12-19 MED ORDER — TAPENTADOL HCL ER 50 MG PO TB12: 50.0000 mg | ORAL_TABLET | Freq: Two times a day (BID) | ORAL | 0 refills | Status: DC

## 2017-12-19 MED ORDER — OXYCODONE-ACETAMINOPHEN 5-325 MG PO TABS: 1.0000 | ORAL_TABLET | Freq: Three times a day (TID) | ORAL | 0 refills | Status: DC | PRN

## 2017-12-19 NOTE — Progress Notes (Signed)
Subjective:    Patient ID: Kristine Kelley, female    DOB: [birth year], 29 y.o.   MRN: [id number]  HPI: [name] is a 27 year old female who returns for follow up appointment for chronic pain and medication refill. She states her pain is located in her mid-lower back. Pain level not rated. Her current exercise regime is walking and performing stretching exercises.   [name] Morphine Equivalent is 65.00 MME. Her last UDS was Performed on 08/27/2017 it was consistent. UDS was ordered today.   Pain Inventory Average Pain 5 Pain Right Now 6 My pain is   In the last 24 hours, has pain interfered with the following? General activity 4 Relation with others 4 Enjoyment of life 4 What TIME of day is your pain at its worst? night Sleep (in general) Fair  Pain is worse with: walking, sitting and some activites Pain improves with: heat/ice and medication Relief from Meds: 7  Mobility walk without assistance ability to climb steps?  yes do you drive?  yes  Function employed # of hrs/week 40  Neuro/Psych tingling  Prior Studies no  Physicians involved in your care no   Family History  Problem Relation Age of Onset  . Heart disease Mother   . Cancer Mother   . Drug abuse Mother   . Heart disease Father   . Drug abuse Father   . Asthma Brother   . Allergic rhinitis Neg Hx   . Eczema Neg Hx   . Urticaria Neg Hx   . Immunodeficiency Neg Hx   . Angioedema Neg Hx    Social History   Socioeconomic History  . Marital status: Married    Spouse name: Not on file  . Number of children: Not on file  . Years of education: Not on file  . Highest education level: Not on file  Occupational History  . Not on file  Social Needs  . Financial resource strain: Not on file  . Food insecurity:    Worry: Not on file    Inability: Not on file  . Transportation needs:    Medical: Not on file    Non-medical: Not on file  Tobacco Use  . Smoking status: Never Smoker  .  Smokeless tobacco: Never Used  Substance and Sexual Activity  . Alcohol use: No  . Drug use: No  . Sexual activity: Yes    Birth control/protection: Pill  Lifestyle  . Physical activity:    Days per week: Not on file    Minutes per session: Not on file  . Stress: Not on file  Relationships  . Social connections:    Talks on phone: Not on file    Gets together: Not on file    Attends religious service: Not on file    Active member of club or organization: Not on file    Attends meetings of clubs or organizations: Not on file    Relationship status: Not on file  Other Topics Concern  . Not on file  Social History Narrative   Pt lives in 1 story home with her husband   Has 1 step child who does not reside with pt   Some college   Nurse, children's.    Past Surgical History:  Procedure Laterality Date  . APPENDECTOMY    . KIDNEY STONE SURGERY     Past Medical History:  Diagnosis Date  . Asthma   . Benign tumor of pituitary gland (  H[facility])   . Chronic back pain   . Chronic pain   . Fibromyalgia   . Migraine   . PTSD (post-traumatic stress disorder)   . Seizures ([place])   . Traumatic brain injury ([place])    Ht 5\' 2"  (1.575 m)   Wt 207 lb (93.9 kg)   BMI 37.86 kg/m   Opioid Risk Score:   Fall Risk Score:  `1  Depression screen PHQ 2/9  Depression screen [facility] 2/9 12/19/2017 11/19/2017 09/30/2017 07/03/2017 06/10/2017 06/10/2017 03/05/2017  Decreased Interest 0 0 0 1 1 0 0  Down, Depressed, Hopeless 0 0 0 0 0 0 0  PHQ - 2 Score 0 0 0 1 1 0 0  Altered sleeping - - - - 3 - -  Tired, decreased energy - - - - 3 - -  Change in appetite - - - - 2 - -  Feeling bad or failure about yourself  - - - - 1 - -  Trouble concentrating - - - - 0 - -  Moving slowly or fidgety/restless - - - - 1 - -  Suicidal thoughts - - - - 0 - -  PHQ-9 Score - - - - 11 - -  Difficult doing work/chores - - - - Somewhat difficult - -      Review of Systems  Constitutional:        Night sweats, gain weight  All other systems reviewed and are negative.      Objective:   Physical Exam  Constitutional: She is oriented to person, place, and time. She appears well-developed and well-nourished.  HENT:  Head: Normocephalic and atraumatic.  Neck: Normal range of motion. Neck supple.  Cardiovascular: Normal rate and regular rhythm.  Pulmonary/Chest: Effort normal and breath sounds normal.  Musculoskeletal:  Normal Muscle Bulk and Muscle Testing Reveals: Upper Extremities: Full ROM and Muscle Strength 5/5 Thoracic Paraspinal Tenderness: T-3-T-7 Lumbar Paraspinal Tenderness: L-3-L-5 Lower Extremities: Full ROM and Muscle Strength 5/5 Arises from Table with ease Narrow Based gait  Neurological: She is alert and oriented to person, place, and time.  Skin: Skin is warm and dry.  Psychiatric: She has a normal mood and affect.  Nursing note and vitals reviewed.         Assessment & Plan:  1. Chronic Pain Syndrome:Continue current medication regimen:  Refilled:Tapentadol 50 mg ER Q 12 hours #60andOxycodone 5/325 mg one tablet every 8 hours as needed #100.12/19/2017 2.Fibromyalgia:Continue HEP as Toleratedand continue current medication regimen with amitriptyline.12/19/2017 3. History of PTSD: Continue Counseling/ Awaiting Psychiatry appointment. 12/19/2017 4. History TBI: Continue to Monitor. 12/19/2017 5. Migraine with Aura: Continue Fiorcet . 12/19/2017 6. Labile Blood Pressure: Blood Pressure Stable Today: Cardiology Following. 12/19/2017 7. ? POTS: Cardiology Following.12/19/2017 9. Lumbar Radiculitis:Continue current medication regimen with: Elavil.Marland Kitchen 12/19/2017 10. Muscle Spasm: Continuecurrent medication regimen withFlexeril. 12/19/2017 11. Depression: Continue Cymbalta: PCP Following. Awaiting Psychiatric appointment. 12/19/2017.  55minutes of face to face patient care time was spent during this visit. All questions were encouraged and  answered.  F/U in 1 month

## 2017-12-25 LAB — TOXASSURE SELECT,+ANTIDEPR,UR

## 2017-12-26 ENCOUNTER — Ambulatory Visit: Payer: BLUE CROSS/BLUE SHIELD | Admitting: Neurology

## 2017-12-29 ENCOUNTER — Telehealth: Payer: Self-pay | Admitting: *Deleted

## 2017-12-29 NOTE — Telephone Encounter (Signed)
Urine drug screen for this encounter is consistent for prescribed medication 

## 2018-01-06 ENCOUNTER — Other Ambulatory Visit: Payer: Self-pay | Admitting: Cardiovascular Disease

## 2018-01-13 ENCOUNTER — Encounter: Payer: BLUE CROSS/BLUE SHIELD | Admitting: Registered Nurse

## 2018-01-14 ENCOUNTER — Encounter: Payer: BLUE CROSS/BLUE SHIELD | Attending: Physical Medicine & Rehabilitation | Admitting: Registered Nurse

## 2018-01-14 ENCOUNTER — Encounter: Payer: Self-pay | Admitting: Registered Nurse

## 2018-01-14 ENCOUNTER — Other Ambulatory Visit: Payer: Self-pay

## 2018-01-14 ENCOUNTER — Other Ambulatory Visit: Payer: Self-pay | Admitting: Physician Assistant

## 2018-01-14 VITALS — BP 116/83 | HR 87 | Ht 62.0 in | Wt 211.8 lb

## 2018-01-14 DIAGNOSIS — Z79899 Other long term (current) drug therapy: Secondary | ICD-10-CM | POA: Insufficient documentation

## 2018-01-14 DIAGNOSIS — M549 Dorsalgia, unspecified: Secondary | ICD-10-CM | POA: Insufficient documentation

## 2018-01-14 DIAGNOSIS — F4310 Post-traumatic stress disorder, unspecified: Secondary | ICD-10-CM | POA: Diagnosis not present

## 2018-01-14 DIAGNOSIS — J45909 Unspecified asthma, uncomplicated: Secondary | ICD-10-CM | POA: Diagnosis not present

## 2018-01-14 DIAGNOSIS — M7918 Myalgia, other site: Secondary | ICD-10-CM | POA: Diagnosis not present

## 2018-01-14 DIAGNOSIS — Z813 Family history of other psychoactive substance abuse and dependence: Secondary | ICD-10-CM | POA: Insufficient documentation

## 2018-01-14 DIAGNOSIS — E669 Obesity, unspecified: Secondary | ICD-10-CM | POA: Diagnosis not present

## 2018-01-14 DIAGNOSIS — M797 Fibromyalgia: Secondary | ICD-10-CM

## 2018-01-14 DIAGNOSIS — G8929 Other chronic pain: Secondary | ICD-10-CM

## 2018-01-14 DIAGNOSIS — Z8249 Family history of ischemic heart disease and other diseases of the circulatory system: Secondary | ICD-10-CM | POA: Diagnosis not present

## 2018-01-14 DIAGNOSIS — Z87820 Personal history of traumatic brain injury: Secondary | ICD-10-CM | POA: Diagnosis not present

## 2018-01-14 DIAGNOSIS — G40909 Epilepsy, unspecified, not intractable, without status epilepticus: Secondary | ICD-10-CM | POA: Diagnosis not present

## 2018-01-14 DIAGNOSIS — G894 Chronic pain syndrome: Secondary | ICD-10-CM | POA: Diagnosis not present

## 2018-01-14 DIAGNOSIS — M546 Pain in thoracic spine: Secondary | ICD-10-CM

## 2018-01-14 DIAGNOSIS — R0989 Other specified symptoms and signs involving the circulatory and respiratory systems: Secondary | ICD-10-CM | POA: Diagnosis not present

## 2018-01-14 DIAGNOSIS — G43909 Migraine, unspecified, not intractable, without status migrainosus: Secondary | ICD-10-CM | POA: Insufficient documentation

## 2018-01-14 DIAGNOSIS — Z5181 Encounter for therapeutic drug level monitoring: Secondary | ICD-10-CM

## 2018-01-14 DIAGNOSIS — D497 Neoplasm of unspecified behavior of endocrine glands and other parts of nervous system: Secondary | ICD-10-CM | POA: Diagnosis not present

## 2018-01-14 DIAGNOSIS — Z30019 Encounter for initial prescription of contraceptives, unspecified: Secondary | ICD-10-CM

## 2018-01-14 MED ORDER — OXYCODONE-ACETAMINOPHEN 5-325 MG PO TABS
1.0000 | ORAL_TABLET | Freq: Three times a day (TID) | ORAL | 0 refills | Status: DC | PRN
Start: 2018-01-14 — End: 2018-02-13

## 2018-01-14 MED ORDER — TAPENTADOL HCL ER 50 MG PO TB12: 50.0000 mg | ORAL_TABLET | Freq: Two times a day (BID) | ORAL | 0 refills | Status: DC

## 2018-01-14 NOTE — Telephone Encounter (Signed)
[facility] refill Last Refill:01/06/17 # 1 pkg 11 RF Last OV: 01/06/17 PCP: [facility] [address]

## 2018-01-14 NOTE — Progress Notes (Signed)
Subjective:    Patient ID: Kristine Kelley, female    DOB: 27-Jul-1990, 27 y.o.   MRN: [id number]  HPI: [name] is a 27 year old female who returns for follow up appointment for chronic pain and medication refill. She states her pain is located in her mid back. Also reports the tapentadol has made a significant difference in her pain. She rates her pain 7. Her current exercise regime is walking and performing stretching exercises.   Ms. Milone Morphine Equivalent is 70.00 MME. Last UDS was performed on 12/19/2017, it was consistent.    Pain Inventory Average Pain 6 Pain Right Now 7 My pain is intermittent, sharp, burning, dull, stabbing and aching  In the last 24 hours, has pain interfered with the following? General activity 6 Relation with others 6 Enjoyment of life 6 What TIME of day is your pain at its worst? evening Sleep (in general) Fair  Pain is worse with: walking, inactivity and standing Pain improves with: rest, heat/ice, therapy/exercise and medication Relief from Meds: 7  Mobility how many minutes can you walk? 30 ability to climb steps?  yes do you drive?  yes  Function employed # of hrs/week 40 I need assistance with the following:  meal prep and household duties  Neuro/Psych No problems in this area  Prior Studies Any changes since last visit?  no  Physicians involved in your care Any changes since last visit?  no   Family History  Problem Relation Age of Onset  . Heart disease Mother   . Cancer Mother   . Drug abuse Mother   . Heart disease Father   . Drug abuse Father   . Asthma Brother   . Allergic rhinitis Neg Hx   . Eczema Neg Hx   . Urticaria Neg Hx   . Immunodeficiency Neg Hx   . Angioedema Neg Hx    Social History   Socioeconomic History  . Marital status: Married    Spouse name: Not on file  . Number of children: Not on file  . Years of education: Not on file  . Highest education level: Not on file  Occupational History   . Not on file  Social Needs  . Financial resource strain: Not on file  . Food insecurity:    Worry: Not on file    Inability: Not on file  . Transportation needs:    Medical: Not on file    Non-medical: Not on file  Tobacco Use  . Smoking status: Never Smoker  . Smokeless tobacco: Never Used  Substance and Sexual Activity  . Alcohol use: No  . Drug use: No  . Sexual activity: Yes    Birth control/protection: Pill  Lifestyle  . Physical activity:    Days per week: Not on file    Minutes per session: Not on file  . Stress: Not on file  Relationships  . Social connections:    Talks on phone: Not on file    Gets together: Not on file    Attends religious service: Not on file    Active member of club or organization: Not on file    Attends meetings of clubs or organizations: Not on file    Relationship status: Not on file  Other Topics Concern  . Not on file  Social History Narrative   Pt lives in 1 story home with her husband   Has 1 step child who does not reside with pt   Some college  Nurse, children's.    Past Surgical History:  Procedure Laterality Date  . APPENDECTOMY    . KIDNEY STONE SURGERY     Past Medical History:  Diagnosis Date  . Asthma   . Benign tumor of pituitary gland ([place])   . Chronic back pain   . Chronic pain   . Fibromyalgia   . Migraine   . PTSD (post-traumatic stress disorder)   . Seizures ([place])   . Traumatic brain injury ([place])    BP 116/83   Pulse 87   Ht 5\' 2"  (1.575 m)   Wt 211 lb 12.8 oz (96.1 kg)   LMP 12/18/2017   SpO2 97%   BMI 38.74 kg/m   Opioid Risk Score:   Fall Risk Score:  `1  Depression screen PHQ 2/9  Depression screen [facility] 2/9 01/14/2018 12/19/2017 11/19/2017 09/30/2017 07/03/2017 06/10/2017 06/10/2017  Decreased Interest 0 0 0 0 1 1 0  Down, Depressed, Hopeless 0 0 0 0 0 0 0  PHQ - 2 Score 0 0 0 0 1 1 0  Altered sleeping - - - - - 3 -  Tired, decreased energy - - - - - 3 -  Change in  appetite - - - - - 2 -  Feeling bad or failure about yourself  - - - - - 1 -  Trouble concentrating - - - - - 0 -  Moving slowly or fidgety/restless - - - - - 1 -  Suicidal thoughts - - - - - 0 -  PHQ-9 Score - - - - - 11 -  Difficult doing work/chores - - - - - Somewhat difficult -    Review of Systems  Constitutional: Positive for unexpected weight change.       Night sweats   HENT: Negative.   Eyes: Negative.   Respiratory: Negative.   Cardiovascular: Negative.   Gastrointestinal: Negative.   Endocrine: Negative.   Genitourinary: Negative.   Musculoskeletal: Negative.   Skin: Negative.   Allergic/Immunologic: Negative.   Neurological: Negative.   Hematological: Negative.   Psychiatric/Behavioral: Negative.   All other systems reviewed and are negative.      Objective:   Physical Exam  Constitutional: She appears well-developed and well-nourished.  HENT:  Head: Normocephalic and atraumatic.  Neck: Normal range of motion. Neck supple.  Cardiovascular: Normal rate and regular rhythm.  Pulmonary/Chest: Effort normal and breath sounds normal.  Musculoskeletal:  Normal Muscle Bulk and Muscle Testing Reveals:  Upper Extremities: Full ROM and Muscle Strength 5/5 Thoracic Paraspinal Tenderness: T-7-T-9 Lower Extremities: Full ROM and Muscle Strength 5/5 Arises from table with ease Narrow Based Gait   Skin: Skin is warm and dry.  Psychiatric: She has a normal mood and affect. Her behavior is normal.  Nursing note and vitals reviewed.         Assessment & Plan:  1. Chronic Pain Syndrome:Continue current medication regimen:  Refilled:Tapentadol 50 mg ER Q 12 hours #60andOxycodone 5/325 mg one tablet every 8 hours as needed #100.01/14/2018 2.Fibromyalgia:Continue HEP as Toleratedand continue current medication regimen with amitriptyline.01/14/2018 3. History of PTSD: Continue Counseling/ Awaiting Psychiatry appointment. 01/14/2018 4. History TBI: Continue  to Monitor. 01/14/2018 5. Migraine with Aura: Continue Fiorcet . 01/14/2018 6. Labile Blood Pressure: Blood Pressure Stable Today: Blood Pressure Stable today. Cardiology Following. 01/14/2018 7. ? POTS: Cardiology Following.01/14/2018 9. Lumbar Radiculitis:Continue current medication regimen with: Elavil.Marland Kitchen 01/14/2018 10. Muscle Spasm: Continuecurrent medication regimen withFlexeril. 01/14/2018 11. Depression: Continue Cymbalta: PCP Following. Awaiting  Psychiatric appointment. 01/14/2018.  4minutes of face to face patient care time was spent during this visit. All questions were encouraged and answered.  F/U in 1 month

## 2018-01-19 ENCOUNTER — Ambulatory Visit: Payer: BLUE CROSS/BLUE SHIELD | Admitting: Cardiology

## 2018-01-23 ENCOUNTER — Other Ambulatory Visit: Payer: Self-pay | Admitting: Physician Assistant

## 2018-01-23 DIAGNOSIS — Z30019 Encounter for initial prescription of contraceptives, unspecified: Secondary | ICD-10-CM

## 2018-01-23 NOTE — Telephone Encounter (Signed)
Prescription for APRI 0.15-0.03 expired on 01/06/18. LOV  09/30/17 [name]

## 2018-02-10 ENCOUNTER — Other Ambulatory Visit: Payer: Self-pay | Admitting: Registered Nurse

## 2018-02-11 ENCOUNTER — Ambulatory Visit: Payer: BLUE CROSS/BLUE SHIELD | Admitting: Cardiology

## 2018-02-13 ENCOUNTER — Encounter: Payer: BLUE CROSS/BLUE SHIELD | Attending: Physical Medicine & Rehabilitation | Admitting: Registered Nurse

## 2018-02-13 ENCOUNTER — Encounter: Payer: Self-pay | Admitting: Registered Nurse

## 2018-02-13 VITALS — BP 134/88 | HR 100 | Resp 14 | Ht 62.0 in | Wt 208.0 lb

## 2018-02-13 DIAGNOSIS — R0989 Other specified symptoms and signs involving the circulatory and respiratory systems: Secondary | ICD-10-CM | POA: Diagnosis not present

## 2018-02-13 DIAGNOSIS — Z813 Family history of other psychoactive substance abuse and dependence: Secondary | ICD-10-CM | POA: Insufficient documentation

## 2018-02-13 DIAGNOSIS — M5416 Radiculopathy, lumbar region: Secondary | ICD-10-CM

## 2018-02-13 DIAGNOSIS — M797 Fibromyalgia: Secondary | ICD-10-CM | POA: Diagnosis not present

## 2018-02-13 DIAGNOSIS — G894 Chronic pain syndrome: Secondary | ICD-10-CM | POA: Diagnosis not present

## 2018-02-13 DIAGNOSIS — Z79899 Other long term (current) drug therapy: Secondary | ICD-10-CM

## 2018-02-13 DIAGNOSIS — G40909 Epilepsy, unspecified, not intractable, without status epilepticus: Secondary | ICD-10-CM | POA: Insufficient documentation

## 2018-02-13 DIAGNOSIS — M546 Pain in thoracic spine: Secondary | ICD-10-CM

## 2018-02-13 DIAGNOSIS — G43909 Migraine, unspecified, not intractable, without status migrainosus: Secondary | ICD-10-CM | POA: Insufficient documentation

## 2018-02-13 DIAGNOSIS — D497 Neoplasm of unspecified behavior of endocrine glands and other parts of nervous system: Secondary | ICD-10-CM | POA: Insufficient documentation

## 2018-02-13 DIAGNOSIS — M7918 Myalgia, other site: Secondary | ICD-10-CM | POA: Diagnosis not present

## 2018-02-13 DIAGNOSIS — Z87820 Personal history of traumatic brain injury: Secondary | ICD-10-CM | POA: Diagnosis not present

## 2018-02-13 DIAGNOSIS — M549 Dorsalgia, unspecified: Secondary | ICD-10-CM | POA: Insufficient documentation

## 2018-02-13 DIAGNOSIS — Z8249 Family history of ischemic heart disease and other diseases of the circulatory system: Secondary | ICD-10-CM | POA: Insufficient documentation

## 2018-02-13 DIAGNOSIS — F4310 Post-traumatic stress disorder, unspecified: Secondary | ICD-10-CM | POA: Diagnosis not present

## 2018-02-13 DIAGNOSIS — J45909 Unspecified asthma, uncomplicated: Secondary | ICD-10-CM | POA: Insufficient documentation

## 2018-02-13 DIAGNOSIS — G8929 Other chronic pain: Secondary | ICD-10-CM

## 2018-02-13 DIAGNOSIS — E669 Obesity, unspecified: Secondary | ICD-10-CM | POA: Insufficient documentation

## 2018-02-13 DIAGNOSIS — Z5181 Encounter for therapeutic drug level monitoring: Secondary | ICD-10-CM

## 2018-02-13 MED ORDER — OXYCODONE-ACETAMINOPHEN 5-325 MG PO TABS: 1.0000 | ORAL_TABLET | Freq: Three times a day (TID) | ORAL | 0 refills | Status: DC | PRN

## 2018-02-13 MED ORDER — TAPENTADOL HCL ER 50 MG PO TB12: 50.0000 mg | ORAL_TABLET | Freq: Two times a day (BID) | ORAL | 0 refills | Status: DC

## 2018-02-13 MED ORDER — OXYCODONE-ACETAMINOPHEN 5-325 MG PO TABS
1.0000 | ORAL_TABLET | Freq: Three times a day (TID) | ORAL | 0 refills | Status: DC | PRN
Start: 2018-02-13 — End: 2018-02-13
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
## 2018-03-09 ENCOUNTER — Telehealth: Payer: Self-pay | Admitting: *Deleted

## 2018-03-09 ENCOUNTER — Ambulatory Visit: Payer: BLUE CROSS/BLUE SHIELD | Admitting: Cardiology

## 2018-03-09 ENCOUNTER — Encounter: Payer: Self-pay | Admitting: Cardiology

## 2018-03-09 ENCOUNTER — Other Ambulatory Visit: Payer: Self-pay | Admitting: Cardiovascular Disease

## 2018-03-09 ENCOUNTER — Telehealth: Payer: Self-pay | Admitting: Registered Nurse

## 2018-03-09 ENCOUNTER — Telehealth: Payer: Self-pay

## 2018-03-09 VITALS — BP 136/90 | HR 122 | Ht 62.0 in | Wt 212.0 lb

## 2018-03-09 DIAGNOSIS — R002 Palpitations: Secondary | ICD-10-CM | POA: Diagnosis not present

## 2018-03-09 DIAGNOSIS — I10 Essential (primary) hypertension: Secondary | ICD-10-CM

## 2018-03-09 DIAGNOSIS — R000 Tachycardia, unspecified: Secondary | ICD-10-CM

## 2018-03-09 DIAGNOSIS — J4530 Mild persistent asthma, uncomplicated: Secondary | ICD-10-CM

## 2018-03-09 DIAGNOSIS — G894 Chronic pain syndrome: Secondary | ICD-10-CM | POA: Diagnosis not present

## 2018-03-09 DIAGNOSIS — F4310 Post-traumatic stress disorder, unspecified: Secondary | ICD-10-CM

## 2018-03-09 DIAGNOSIS — G4733 Obstructive sleep apnea (adult) (pediatric): Secondary | ICD-10-CM

## 2018-03-09 LAB — TSH: TSH: 0.684 u[IU]/mL (ref 0.450–4.500)

## 2018-03-09 MED ORDER — METOPROLOL TARTRATE 50 MG PO TABS: 50.0000 mg | ORAL_TABLET | Freq: Two times a day (BID) | ORAL | 1 refills | Status: DC

## 2018-03-09 NOTE — Telephone Encounter (Signed)
Placed a call to [place]: PMP Reviewed, she picked her Oxycodone up on 02/13/2018. 03/14/2018 is the 30th day and will allow prescription to be filled on 03/14/2018. Pharmacist and Kristine Kelley is aware of the above.

## 2018-03-09 NOTE — Telephone Encounter (Signed)
Telephone call received from pt while at her pharmacy to request a refill for Metoprolol and Midodrine.  Midodrine is not listed on the patients current medication list.  Pt sts that she has been on Midodrine for years. Adv her that it was d/c in Oct 2018 by another providers office.  Spoke with Jana Half. Per Lurena Joiner pt should not take Midodrine ok to refill Metoprolol.   Pt will f/u with Lurena Joiner in a couple of weeks for medication management. Adv pt that the appt can be scheduled when we call her with her lab results that were drawn today. Adv pt to bring all of her current medications to that appt  Metoprolol refilled mg bid #180 R-1  Pt verbalized understanding.

## 2018-03-09 NOTE — Telephone Encounter (Signed)
error 

## 2018-03-09 NOTE — Assessment & Plan Note (Signed)
Obstructive sleep apnea suspected by symptoms study negative in Nov 2018
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
## 2018-03-09 NOTE — Telephone Encounter (Signed)
Kristine Kelley's husband called and her medication percocet is "do not fill before 03/15/18" which is Sunday and pharmacy not open on Sunday. Can you change with pharmacy to fill on 03/14/18?

## 2018-03-09 NOTE — Assessment & Plan Note (Signed)
Essential hypertension-seen by Dr Gwenlyn Found April 2019-normal echo

## 2018-03-09 NOTE — Assessment & Plan Note (Signed)
Followed in pain clinic

## 2018-03-09 NOTE — Assessment & Plan Note (Signed)
sinus tach confirmed on today's EKG.

## 2018-03-09 NOTE — Patient Instructions (Signed)
Medication Instructions:  Your physician recommends that you continue on your current medications as directed. Please refer to the Current Medication list given to you today.   Labwork: TSH today  Testing/Procedures: None ordered  Follow-Up: We will discuss follow up when we call you with your results  Any Other Special Instructions Will Be Listed Below (If Applicable).     If you need a refill on your cardiac medications before your next appointment, please call your pharmacy.

## 2018-03-09 NOTE — Assessment & Plan Note (Signed)
NSR-ST on 30 day event monitor in April 2019
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
## 2018-03-18 ENCOUNTER — Ambulatory Visit: Payer: BLUE CROSS/BLUE SHIELD | Admitting: Family Medicine

## 2018-03-23 ENCOUNTER — Encounter: Payer: BLUE CROSS/BLUE SHIELD | Attending: Physical Medicine & Rehabilitation | Admitting: Registered Nurse

## 2018-03-23 ENCOUNTER — Encounter: Payer: Self-pay | Admitting: Registered Nurse

## 2018-03-23 ENCOUNTER — Telehealth: Payer: Self-pay

## 2018-03-23 ENCOUNTER — Other Ambulatory Visit: Payer: Self-pay | Admitting: Physician Assistant

## 2018-03-23 VITALS — BP 142/95 | HR 79 | Ht 62.0 in | Wt 211.6 lb

## 2018-03-23 DIAGNOSIS — G894 Chronic pain syndrome: Secondary | ICD-10-CM | POA: Insufficient documentation

## 2018-03-23 DIAGNOSIS — G479 Sleep disorder, unspecified: Secondary | ICD-10-CM

## 2018-03-23 DIAGNOSIS — R0989 Other specified symptoms and signs involving the circulatory and respiratory systems: Secondary | ICD-10-CM | POA: Insufficient documentation

## 2018-03-23 DIAGNOSIS — M797 Fibromyalgia: Secondary | ICD-10-CM | POA: Diagnosis not present

## 2018-03-23 DIAGNOSIS — G43909 Migraine, unspecified, not intractable, without status migrainosus: Secondary | ICD-10-CM | POA: Diagnosis not present

## 2018-03-23 DIAGNOSIS — Z5181 Encounter for therapeutic drug level monitoring: Secondary | ICD-10-CM

## 2018-03-23 DIAGNOSIS — M7918 Myalgia, other site: Secondary | ICD-10-CM

## 2018-03-23 DIAGNOSIS — Z8249 Family history of ischemic heart disease and other diseases of the circulatory system: Secondary | ICD-10-CM | POA: Insufficient documentation

## 2018-03-23 DIAGNOSIS — M5416 Radiculopathy, lumbar region: Secondary | ICD-10-CM

## 2018-03-23 DIAGNOSIS — Z87820 Personal history of traumatic brain injury: Secondary | ICD-10-CM | POA: Diagnosis not present

## 2018-03-23 DIAGNOSIS — E669 Obesity, unspecified: Secondary | ICD-10-CM | POA: Insufficient documentation

## 2018-03-23 DIAGNOSIS — D497 Neoplasm of unspecified behavior of endocrine glands and other parts of nervous system: Secondary | ICD-10-CM | POA: Insufficient documentation

## 2018-03-23 DIAGNOSIS — G40909 Epilepsy, unspecified, not intractable, without status epilepticus: Secondary | ICD-10-CM | POA: Diagnosis not present

## 2018-03-23 DIAGNOSIS — F4310 Post-traumatic stress disorder, unspecified: Secondary | ICD-10-CM | POA: Diagnosis not present

## 2018-03-23 DIAGNOSIS — G8929 Other chronic pain: Secondary | ICD-10-CM | POA: Diagnosis present

## 2018-03-23 DIAGNOSIS — M546 Pain in thoracic spine: Secondary | ICD-10-CM

## 2018-03-23 DIAGNOSIS — Z79899 Other long term (current) drug therapy: Secondary | ICD-10-CM | POA: Insufficient documentation

## 2018-03-23 DIAGNOSIS — I498 Other specified cardiac arrhythmias: Secondary | ICD-10-CM

## 2018-03-23 DIAGNOSIS — M549 Dorsalgia, unspecified: Secondary | ICD-10-CM | POA: Insufficient documentation

## 2018-03-23 DIAGNOSIS — J45909 Unspecified asthma, uncomplicated: Secondary | ICD-10-CM | POA: Insufficient documentation

## 2018-03-23 DIAGNOSIS — Z813 Family history of other psychoactive substance abuse and dependence: Secondary | ICD-10-CM | POA: Diagnosis not present

## 2018-03-23 DIAGNOSIS — G90A Postural orthostatic tachycardia syndrome (POTS): Secondary | ICD-10-CM

## 2018-03-23 DIAGNOSIS — I951 Orthostatic hypotension: Principal | ICD-10-CM

## 2018-03-23 DIAGNOSIS — R000 Tachycardia, unspecified: Principal | ICD-10-CM

## 2018-03-23 MED ORDER — TAPENTADOL HCL ER 50 MG PO TB12: 50.0000 mg | ORAL_TABLET | Freq: Two times a day (BID) | ORAL | 0 refills | Status: DC

## 2018-03-23 MED ORDER — OXYCODONE-ACETAMINOPHEN 5-325 MG PO TABS: 1.0000 | ORAL_TABLET | Freq: Three times a day (TID) | ORAL | 0 refills | Status: DC | PRN

## 2018-03-23 NOTE — Progress Notes (Signed)
Subjective:    Patient ID: Kristine Kelley, female    DOB: 01/14/1991, 27 y.o.   MRN: [id number]  HPI: Kristine Kelley is a 27 year old female who returns for follow up appointment for chronic pain and medication refill. She states her pain is located in her mid- lower back. She rates her pain 7. Her current exercise regime is walking.   Kristine Kelley is 70.00 MME. Last UDS was Performed on 12/19/2017, it was consistent.   Pain Inventory Average Pain 6 Pain Right Now 7 My pain is sharp, burning, dull and aching  In the last 24 hours, has pain interfered with the following? General activity 6 Relation with others 5 Enjoyment of life 6 What TIME of day is your pain at its worst? night Sleep (in general) Poor  Pain is worse with: walking, sitting and inactivity Pain improves with: rest, heat/ice, therapy/exercise and medication Relief from Meds: 7  Mobility ability to climb steps?  yes do you drive?  yes  Function employed # of hrs/week 40  Neuro/Psych No problems in this area  Prior Studies Any changes since last visit?  no  Physicians involved in your care Any changes since last visit?  no   Family History  Problem Relation Age of Onset  . Heart disease Mother   . Cancer Mother   . Drug abuse Mother   . Heart disease Father   . Drug abuse Father   . Asthma Brother   . Allergic rhinitis Neg Hx   . Eczema Neg Hx   . Urticaria Neg Hx   . Immunodeficiency Neg Hx   . Angioedema Neg Hx    Social History   Socioeconomic History  . Marital status: Married    Spouse name: Not on file  . Number of children: Not on file  . Years of education: Not on file  . Highest education level: Not on file  Occupational History  . Not on file  Social Needs  . Financial resource strain: Not on file  . Food insecurity:    Worry: Not on file    Inability: Not on file  . Transportation needs:    Medical: Not on file    Non-medical: Not on file  Tobacco Use   . Smoking status: Never Smoker  . Smokeless tobacco: Never Used  Substance and Sexual Activity  . Alcohol use: No  . Drug use: No  . Sexual activity: Yes    Birth control/protection: Pill  Lifestyle  . Physical activity:    Days per week: Not on file    Minutes per session: Not on file  . Stress: Not on file  Relationships  . Social connections:    Talks on phone: Not on file    Gets together: Not on file    Attends religious service: Not on file    Active member of club or organization: Not on file    Attends meetings of clubs or organizations: Not on file    Relationship status: Not on file  Other Topics Concern  . Not on file  Social History Narrative   Pt lives in 1 story home with her husband   Has 1 step child who does not reside with pt   Some college   Nurse, children's.    Past Surgical History:  Procedure Laterality Date  . APPENDECTOMY    . KIDNEY STONE SURGERY     Past Medical History:  Diagnosis Date  .  Asthma   . Benign tumor of pituitary gland ([place])   . Chronic back pain   . Chronic pain   . Fibromyalgia   . Migraine   . PTSD (post-traumatic stress disorder)   . Seizures ([place])   . Traumatic brain injury ([place])    There were no vitals taken for this visit.  Opioid Risk Score:   Fall Risk Score:  `1  Depression screen PHQ 2/9  Depression screen [facility] 2/9 01/14/2018 12/19/2017 11/19/2017 09/30/2017 07/03/2017 06/10/2017 06/10/2017  Decreased Interest 0 0 0 0 1 1 0  Down, Depressed, Hopeless 0 0 0 0 0 0 0  PHQ - 2 Score 0 0 0 0 1 1 0  Altered sleeping - - - - - 3 -  Tired, decreased energy - - - - - 3 -  Change in appetite - - - - - 2 -  Feeling bad or failure about yourself  - - - - - 1 -  Trouble concentrating - - - - - 0 -  Moving slowly or fidgety/restless - - - - - 1 -  Suicidal thoughts - - - - - 0 -  PHQ-9 Score - - - - - 11 -  Difficult doing work/chores - - - - - Somewhat difficult -     Review of Systems    Constitutional: Positive for diaphoresis and unexpected weight change.  HENT: Negative.   Eyes: Negative.   Respiratory: Negative.   Cardiovascular: Negative.   Gastrointestinal: Negative.   Endocrine: Negative.   Genitourinary: Negative.   Musculoskeletal: Positive for arthralgias, back pain and myalgias.  Skin: Negative.   Allergic/Immunologic: Negative.   Neurological: Negative.   Hematological: Negative.   Psychiatric/Behavioral: Negative.   All other systems reviewed and are negative.      Objective:   Physical Exam  Constitutional: She is oriented to person, place, and time. She appears well-developed and well-nourished.  HENT:  Head: Normocephalic and atraumatic.  Neck: Normal range of motion. Neck supple.  Cardiovascular: Normal rate and regular rhythm.  Pulmonary/Chest: Effort normal and breath sounds normal.  Musculoskeletal:  Normal Muscle Bulk and Muscle Testing Reveals: Upper Extremities: Full ROM and Muscle Strength 5/5 Thoracic Paraspinal Tenderness: T-7-T-9 Lumbar Paraspinal Tenderness: L-4-L-5 Lower Extremities: Full ROM and Muscle Strength 5/5 Arises from Table with ease Narrow Based Gait   Neurological: She is alert and oriented to person, place, and time.  Skin: Skin is warm and dry.  Psychiatric: She has a normal mood and affect. Her behavior is normal.  Nursing note and vitals reviewed.         Assessment & Plan:  1. Chronic Pain Syndrome:Continue current medication regimen:  Refilled:Tapentadol 50 mg ER Q 12 hours #60andOxycodone 5/325 mg one tablet every 8 hours as needed #100.03/23/2018 2.Fibromyalgia:Continue HEP as Toleratedand continue current medication regimen with amitriptyline.03/23/2018 3. History of PTSD: Continue Counseling/ Awaiting Psychiatry appointment/ [facility] Pamphlet Given. 03/23/2018 4. History TBI: Continue to Monitor. 03/23/2018 5. Migraine with Aura: Continue Fiorcet . 03/23/2018 6. Labile Blood  Pressure: Blood Pressure Stable Today:Cardiology Following. 03/23/2018 7. ? POTS: Cardiology Following.03/23/2018 9. Lumbar Radiculitis:Continue current medication regimen with: Elavil.Marland Kitchen 03/23/2018 10. Muscle Spasm: Continuecurrent medication regimen withFlexeril. 03/23/2018 11. Depression: Continue Cymbalta: PCP Following. Awaiting Psychiatric appointment. 03/23/2018. 12. Chronic Bilateral Thoracic Back Pain: Continue current medication regimen. Continue to monitor. 02/20/2018.   54minutes of face to face patient care time was spent during this visit. All questions were encouraged and answered.  F/U in 1 month

## 2018-03-23 NOTE — Telephone Encounter (Signed)
-----   Message from Erlene Quan, [place] sent at 03/20/2018  4:34 PM EDT ----- T please refer this pt for EP evaluation for POTS  LUKE KILROY PA-C 03/20/2018 4:35 PM ----- Message ----- From: Lorretta Harp, MD Sent: 03/09/2018  12:25 PM EDT To: Erlene Quan, PA-C  Yes, please refer to EP. ----- Message ----- From: Ilean China Sent: 03/09/2018  10:47 AM EDT To: Lorretta Harp, MD  Want me to send her to EP-?

## 2018-03-23 NOTE — Telephone Encounter (Signed)
Referral placed; message sent to schedulers.

## 2018-03-23 NOTE — Telephone Encounter (Signed)
Trazodone refill Last Refill:11/04/17 #60 with 1 refill Last OV: 09/30/17 PCP: Loree Fee McVey,PA

## 2018-03-24 ENCOUNTER — Telehealth: Payer: Self-pay | Admitting: *Deleted

## 2018-03-24 NOTE — Telephone Encounter (Signed)
Patient left a message stating that she went to pick up her nucynta and was told it needs a prior authorization.  Prior Authorization submitted. Awaiting response. Patient notified.

## 2018-03-24 NOTE — Telephone Encounter (Signed)
Patient is requesting a refill of the following medications: Requested Prescriptions   Pending Prescriptions Disp Refills  . traZODone (DESYREL) 100 MG tablet [Pharmacy Med Name: TRAZODONE HCL 100 MG TABLET] 60 tablet 1    Sig: TAKE 1 TABLET BY MOUTH AT BEDTIME AS NEEDED FOR SLEEP    Date of patient request: 03/23/2018 Last office visit: 09/30/2017 Date of last refill: 11/04/2017 Last refill amount: 60 Follow up time period per chart:

## 2018-03-26 LAB — DRUG TOX MONITOR 1 W/CONF, ORAL FLD
Amphetamines: NEGATIVE ng/mL (ref <10)
BUPRENORPHINE: NEGATIVE ng/mL (ref <0.10)
Barbiturates: NEGATIVE ng/mL (ref <10)
Benzodiazepines: NEGATIVE ng/mL (ref <0.50)
CODEINE: NEGATIVE ng/mL (ref <2.5)
Cocaine: NEGATIVE ng/mL (ref <5.0)
DIHYDROCODEINE: NEGATIVE ng/mL (ref <2.5)
Fentanyl: NEGATIVE ng/mL (ref <0.10)
HYDROCODONE: NEGATIVE ng/mL (ref <2.5)
HYDROMORPHONE: NEGATIVE ng/mL (ref <2.5)
Heroin Metabolite: NEGATIVE ng/mL (ref <1.0)
MARIJUANA: NEGATIVE ng/mL (ref <2.5)
MDMA: NEGATIVE ng/mL (ref <10)
Meprobamate: NEGATIVE ng/mL (ref <2.5)
Methadone: NEGATIVE ng/mL (ref <5.0)
Morphine: NEGATIVE ng/mL (ref <2.5)
NICOTINE METABOLITE: NEGATIVE ng/mL (ref <5.0)
Norhydrocodone: NEGATIVE ng/mL (ref <2.5)
Noroxycodone: 4.8 ng/mL — ABNORMAL HIGH (ref <2.5)
OPIATES: POSITIVE ng/mL — AB (ref <2.5)
OXYMORPHONE: NEGATIVE ng/mL (ref <2.5)
Oxycodone: 17.5 ng/mL — ABNORMAL HIGH (ref <2.5)
PHENCYCLIDINE: NEGATIVE ng/mL (ref <10)
TAPENTADOL: NEGATIVE ng/mL (ref <5.0)
TRAMADOL: NEGATIVE ng/mL (ref <5.0)
Zolpidem: NEGATIVE ng/mL (ref <5.0)

## 2018-03-26 LAB — DRUG TOX ALC METAB W/CON, ORAL FLD: Alcohol Metabolite: NEGATIVE ng/mL (ref <25)

## 2018-04-01 ENCOUNTER — Telehealth: Payer: Self-pay | Admitting: *Deleted

## 2018-04-01 NOTE — Telephone Encounter (Signed)
Oral swab drug screen was consistent for prescribed medications.  ?

## 2018-04-08 ENCOUNTER — Ambulatory Visit: Payer: BLUE CROSS/BLUE SHIELD | Admitting: Physical Medicine & Rehabilitation

## 2018-04-09 ENCOUNTER — Other Ambulatory Visit: Payer: Self-pay | Admitting: Physician Assistant

## 2018-04-09 ENCOUNTER — Ambulatory Visit: Payer: BLUE CROSS/BLUE SHIELD | Admitting: Internal Medicine

## 2018-04-09 ENCOUNTER — Other Ambulatory Visit: Payer: Self-pay | Admitting: *Deleted

## 2018-04-09 ENCOUNTER — Encounter: Payer: Self-pay | Admitting: Internal Medicine

## 2018-04-09 VITALS — BP 132/86 | HR 100 | Ht 62.0 in | Wt 209.0 lb

## 2018-04-09 DIAGNOSIS — G8929 Other chronic pain: Secondary | ICD-10-CM

## 2018-04-09 DIAGNOSIS — R002 Palpitations: Secondary | ICD-10-CM

## 2018-04-09 DIAGNOSIS — Z23 Encounter for immunization: Secondary | ICD-10-CM | POA: Diagnosis not present

## 2018-04-09 DIAGNOSIS — G894 Chronic pain syndrome: Secondary | ICD-10-CM

## 2018-04-09 DIAGNOSIS — F4310 Post-traumatic stress disorder, unspecified: Secondary | ICD-10-CM

## 2018-04-09 DIAGNOSIS — Z87820 Personal history of traumatic brain injury: Secondary | ICD-10-CM

## 2018-04-09 DIAGNOSIS — M797 Fibromyalgia: Secondary | ICD-10-CM

## 2018-04-09 MED ORDER — AMITRIPTYLINE HCL 75 MG PO TABS: 75.0000 mg | ORAL_TABLET | Freq: Every day | ORAL | 5 refills | Status: AC

## 2018-04-09 NOTE — Patient Instructions (Addendum)

## 2018-04-09 NOTE — Addendum Note (Signed)
Addended by: Evans Lance on: 04/09/2018 05:38 PM   Modules accepted: Level of Service

## 2018-04-09 NOTE — Telephone Encounter (Signed)
Cymbalta 30 mgrefill Last Refill:09/30/17 # 90    3 refills Last OV: 09/30/17 PCP: [place], [place]  It looks like this has been filled on 04/09/18.   Wasn't sure it was done.

## 2018-04-09 NOTE — Progress Notes (Signed)
HPI Kristine Kelley is referred today by Kerin Ransom for evaluation of syncope and autonomic dysfunction. She has a h/o syncope in the past. She has a known pituitary adenoma. She has HTN and obesity. She has passed out in the past but has learned that when she feels a spell, if she lies down quickly, the spells will pass. They are almost always related to upright posture. She has learned to lie down and when she does the spells pass. She has bitten her tongue remotely. No loss of bowel or bladder continence Allergies  Allergen Reactions  . Ambien [Zolpidem Tartrate] Other (See Comments)    PTSD, nightmares   . Dilantin [Phenytoin]     Intravenously - it burns   . Naproxen Hives     Current Outpatient Medications  Medication Sig Dispense Refill  . albuterol (PROVENTIL HFA;VENTOLIN HFA) 108 (90 Base) MCG/ACT inhaler Inhale 2 puffs into the lungs every 4 (four) hours as needed for wheezing or shortness of breath (cough, shortness of breath or wheezing.). 1 Inhaler 2  . amitriptyline (ELAVIL) 75 MG tablet Take 1 tablet (75 mg total) by mouth at bedtime. 30 tablet 5  . APRI 0.15-30 MG-MCG tablet TAKE 1 TABLET BY MOUTH EVERY DAY 28 tablet 2  . butalbital-acetaminophen-caffeine (FIORICET, ESGIC) 50-325-40 MG tablet As directed  0  . cyclobenzaprine (FLEXERIL) 10 MG tablet TAKE 1 TABLET BY MOUTH 3 TIMES DAILY AS NEEDED 60 tablet 3  . diclofenac (VOLTAREN) 50 MG EC tablet TAKE 1 TABLET BY MOUTH AT ONSET OF migraine. DO not TAKE more THAN 2 TO 3 TIMES PER WEEK 10 tablet 4  . Diclofenac Potassium (CAMBIA) 50 MG PACK Use 1 packet at onset of migraine. Do not use more than 3 times a week 9 each 5  . DULoxetine (CYMBALTA) 30 MG capsule Take 3 capsules (90 mg total) by mouth daily. 90 capsule 3  . EPINEPHrine 0.15 MG/0.15ML IJ injection Inject 0.15 mLs (0.15 mg total) into the muscle as needed for anaphylaxis. 2 Device 0  . fluticasone (FLONASE) 50 MCG/ACT nasal spray Use 1 spray per nostril 1-2 times  daily as needed 16 g 5  . fluticasone (FLOVENT HFA) 110 MCG/ACT inhaler Inhale 2 puffs into the lungs 2 (two) times daily. During asthma flares. Use with spacer device. 1 Inhaler 5  . hydrALAZINE (APRESOLINE) 10 MG tablet Take 10 mg by mouth as directed.    . hydrALAZINE (APRESOLINE) 10 MG tablet TAKE 2 TABLETS BY MOUTH 3 TIMES DAILY AS NEEDED 180 tablet 3  . ibuprofen (ADVIL,MOTRIN) 800 MG tablet Take 800 mg by mouth 2 (two) times daily as needed.    . lamoTRIgine (LAMICTAL) 100 MG tablet Take 1 tablet (100 mg total) daily by mouth. Take 1 tab in AM, 2 tabs in PM 90 tablet 6  . levocetirizine (XYZAL) 5 MG tablet Take 1 tablet (5 mg total) by mouth daily as needed for allergies. 30 tablet 5  . metoprolol tartrate (LOPRESSOR) 50 MG tablet TAKE 1 TABLET BY MOUTH 2 TIMES DAILY. NEED OFFICE VISIT 60 tablet 5  . montelukast (SINGULAIR) 10 MG tablet Take 1 tablet (10 mg total) by mouth at bedtime. 30 tablet 5  . NARCAN 4 MG/0.1ML LIQD nasal spray kit As directed  0  . Olopatadine HCl (PAZEO) 0.7 % SOLN Place 1 drop into both eyes daily as needed. 1 Bottle 5  . Olopatadine HCl 0.2 % SOLN Use one drop each eye once daily. 2.5 mL 5  .  ondansetron (ZOFRAN) 4 MG tablet Take 4 mg by mouth every 8 (eight) hours as needed for nausea or vomiting.    . ondansetron (ZOFRAN-ODT) 4 MG disintegrating tablet Take 1 tablet by mouth every 8 (eight) hours as needed.    Marland Kitchen oxyCODONE-acetaminophen (PERCOCET/ROXICET) 5-325 MG tablet Take 1 tablet by mouth every 8 (eight) hours as needed for severe pain. may take an extra tablet when pain is severe 10 days out the month 100 tablet 0  . rizatriptan (MAXALT) 10 MG tablet As directed  0  . rizatriptan (MAXALT) 10 MG tablet Take 1 tablet by mouth as needed for migraine. May repeat in 2 hours if needed. Max dose 79m/day 30 tablet 1  . tapentadol (NUCYNTA) 50 MG 12 hr tablet Take 1 tablet (50 mg total) by mouth every 12 (twelve) hours. 60 tablet 0  . traZODone (DESYREL) 100 MG  tablet TAKE 1 TABLET BY MOUTH AT BEDTIME AS NEEDED FOR SLEEP 60 tablet 1   No current facility-administered medications for this visit.      Past Medical History:  Diagnosis Date  . Asthma   . Benign tumor of pituitary gland ([facility]   . Chronic back pain   . Chronic pain   . Fibromyalgia   . Migraine   . PTSD (post-traumatic stress disorder)   . Seizures ([facility]   . Traumatic brain injury ([facility]     ROS:   All systems reviewed and negative except as noted in the HPI.   Past Surgical History:  Procedure Laterality Date  . APPENDECTOMY    . KIDNEY STONE SURGERY       Family History  Problem Relation Age of Onset  . Heart disease Mother   . Cancer Mother   . Drug abuse Mother   . Heart disease Father   . Drug abuse Father   . Asthma Brother   . Allergic rhinitis Neg Hx   . Eczema Neg Hx   . Urticaria Neg Hx   . Immunodeficiency Neg Hx   . Angioedema Neg Hx      Social History   Socioeconomic History  . Marital status: Married    Spouse name: Not on file  . Number of children: Not on file  . Years of education: Not on file  . Highest education level: Not on file  Occupational History  . Not on file  Social Needs  . Financial resource strain: Not on file  . Food insecurity:    Worry: Not on file    Inability: Not on file  . Transportation needs:    Medical: Not on file    Non-medical: Not on file  Tobacco Use  . Smoking status: Never Smoker  . Smokeless tobacco: Never Used  Substance and Sexual Activity  . Alcohol use: No  . Drug use: No  . Sexual activity: Yes    Birth control/protection: Pill  Lifestyle  . Physical activity:    Days per week: Not on file    Minutes per session: Not on file  . Stress: Not on file  Relationships  . Social connections:    Talks on phone: Not on file    Gets together: Not on file    Attends religious service: Not on file    Active member of club or organization: Not on file    Attends meetings of clubs or  organizations: Not on file    Relationship status: Not on file  . Intimate partner violence:    Fear  of current or ex partner: Not on file    Emotionally abused: Not on file    Physically abused: Not on file    Forced sexual activity: Not on file  Other Topics Concern  . Not on file  Social History Narrative   Pt lives in 1 story home with her husband   Has 1 step child who does not reside with pt   Some college   Nurse, children's.      BP 132/86   Pulse 100   Ht '5\' 2"'  (1.575 m)   Wt 209 lb (94.8 kg)   SpO2 97%   BMI 38.23 kg/m   Physical Exam:  Well appearing NAD HEENT: Unremarkable Neck:  No JVD, no thyromegally Lymphatics:  No adenopathy Back:  No CVA tenderness Lungs:  Clear HEART:  Regular rate rhythm, no murmurs, no rubs, no clicks Abd:  soft, positive bowel sounds, no organomegally, no rebound, no guarding Ext:  2 plus pulses, no edema, no cyanosis, no clubbing Skin:  No rashes no nodules Neuro:  CN II through XII intact, motor grossly intact  EKG - nsr   Assess/Plan: 1. Syncope - her symptoms appear to be due to autonomic dysfunction. She notes that her mother used to pass out and got a ppm. She seems to mostly have orthostatic symptoms although today she did not have any evidence of orthostasis. I discussed the importance of maintaining adequate hydration. Also the importance of lying down when she feels a spell coming on.  2. Pituitary adenoma - she did not give me much detail on this except that she has never had surgery. She will be referred to see Dr. Buddy Duty. 3. HTN - her blood pressure is ok today. I would like for her bp to run in the 140-150 range for now. 4. Obesity - she is overweight and needs to lose weight.   Mikle Bosworth.D.

## 2018-04-14 ENCOUNTER — Encounter: Payer: Self-pay | Admitting: Physical Medicine & Rehabilitation

## 2018-04-14 ENCOUNTER — Other Ambulatory Visit: Payer: Self-pay

## 2018-04-14 ENCOUNTER — Encounter
Payer: BLUE CROSS/BLUE SHIELD | Attending: Physical Medicine & Rehabilitation | Admitting: Physical Medicine & Rehabilitation

## 2018-04-14 VITALS — BP 121/81 | HR 104 | Ht 62.0 in | Wt 213.2 lb

## 2018-04-14 DIAGNOSIS — R0989 Other specified symptoms and signs involving the circulatory and respiratory systems: Secondary | ICD-10-CM | POA: Diagnosis not present

## 2018-04-14 DIAGNOSIS — G40909 Epilepsy, unspecified, not intractable, without status epilepticus: Secondary | ICD-10-CM | POA: Diagnosis not present

## 2018-04-14 DIAGNOSIS — Z8249 Family history of ischemic heart disease and other diseases of the circulatory system: Secondary | ICD-10-CM | POA: Diagnosis not present

## 2018-04-14 DIAGNOSIS — M5416 Radiculopathy, lumbar region: Secondary | ICD-10-CM | POA: Diagnosis not present

## 2018-04-14 DIAGNOSIS — Z813 Family history of other psychoactive substance abuse and dependence: Secondary | ICD-10-CM | POA: Diagnosis not present

## 2018-04-14 DIAGNOSIS — E669 Obesity, unspecified: Secondary | ICD-10-CM | POA: Insufficient documentation

## 2018-04-14 DIAGNOSIS — J45909 Unspecified asthma, uncomplicated: Secondary | ICD-10-CM | POA: Diagnosis not present

## 2018-04-14 DIAGNOSIS — M797 Fibromyalgia: Secondary | ICD-10-CM | POA: Insufficient documentation

## 2018-04-14 DIAGNOSIS — M549 Dorsalgia, unspecified: Secondary | ICD-10-CM | POA: Diagnosis not present

## 2018-04-14 DIAGNOSIS — G894 Chronic pain syndrome: Secondary | ICD-10-CM | POA: Diagnosis not present

## 2018-04-14 DIAGNOSIS — Z87820 Personal history of traumatic brain injury: Secondary | ICD-10-CM

## 2018-04-14 DIAGNOSIS — F4310 Post-traumatic stress disorder, unspecified: Secondary | ICD-10-CM | POA: Insufficient documentation

## 2018-04-14 DIAGNOSIS — G43909 Migraine, unspecified, not intractable, without status migrainosus: Secondary | ICD-10-CM | POA: Insufficient documentation

## 2018-04-14 DIAGNOSIS — D497 Neoplasm of unspecified behavior of endocrine glands and other parts of nervous system: Secondary | ICD-10-CM | POA: Insufficient documentation

## 2018-04-14 DIAGNOSIS — Z79899 Other long term (current) drug therapy: Secondary | ICD-10-CM | POA: Diagnosis not present

## 2018-04-14 DIAGNOSIS — G8929 Other chronic pain: Secondary | ICD-10-CM | POA: Diagnosis present

## 2018-04-14 MED ORDER — TAPENTADOL HCL ER 50 MG PO TB12: 50.0000 mg | ORAL_TABLET | Freq: Two times a day (BID) | ORAL | 0 refills | Status: DC

## 2018-04-14 MED ORDER — OXYCODONE-ACETAMINOPHEN 5-325 MG PO TABS: 1.0000 | ORAL_TABLET | Freq: Three times a day (TID) | ORAL | 0 refills | Status: DC | PRN

## 2018-04-14 NOTE — Patient Instructions (Signed)
DECREASE CYMBALTA TO 30MG  DAILY AND OBSERVE YOUR HEART RATE AND BLOOD PRESSURE   WE CAN CONSIDER COMING OFF IT ENTIRELY BASED ON YOUR RESPONSE

## 2018-04-14 NOTE — Progress Notes (Signed)
Subjective:    Patient ID: Kristine Kelley, female    DOB: 09-16-1990, 27 y.o.   MRN: [id number]  HPI   Kristine Kelley is here in follow-up of her chronic mid to low back pain.  At last saw her in March and she has been seeing her nurse practitioner Danella Sensing in the meantime. She is using nucynta 50mg  ER q12 with percocet 5/325  q8 prn for pain control.  She really feels that the Nucynta has made a big difference for her.  She ran into some problems in late summer due to insurance authorization expiring for the [employer].  She missed it for a couple weeks but now seems to have gotten back on schedule.   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  She also tries to exercise regularly.  She saw an electrophysiologist regarding her potential POTS.  He recommended that she increase her salt intake.  She remains on a beta-blocker and other meds to help with her hypertensive spikes.  She does note that her headaches worsen when her blood pressure increases to greater than 170/100.  She did reduce her Cymbalta from 90-60 per recommendation of the nurse practitioner which seems to help with her heart rate.  Mood has been good.  Pain Inventory Average Pain 7 Pain Right Now 6 My pain is intermittent, burning, dull and aching  In the last 24 hours, has pain interfered with the following? General activity 7 Relation with others 5 Enjoyment of life 6 What TIME of day is your pain at its worst? morning and night Sleep (in general) Good  Pain is worse with: walking, inactivity and standing Pain improves with: rest, heat/ice, therapy/exercise and medication Relief from Meds: 5  Mobility walk without assistance how many minutes can you walk? 30 Do you have any goals in this area?  yes  Function employed # of hrs/week 20 I need assistance with the following:  meal prep, household duties and shopping  Neuro/Psych dizziness  Prior Studies Any  changes since last visit?  no  Physicians involved in your care Any changes since last visit?  no   Family History  Problem Relation Age of Onset  . Heart disease Mother   . Cancer Mother   . Drug abuse Mother   . Heart disease Father   . Drug abuse Father   . Asthma Brother   . Allergic rhinitis Neg Hx   . Eczema Neg Hx   . Urticaria Neg Hx   . Immunodeficiency Neg Hx   . Angioedema Neg Hx    Social History   Socioeconomic History  . Marital status: Married    Spouse name: Not on file  . Number of children: Not on file  . Years of education: Not on file  . Highest education level: Not on file  Occupational History  . Not on file  Social Needs  . Financial resource strain: Not on file  . Food insecurity:    Worry: Not on file    Inability: Not on file  . Transportation needs:    Medical: Not on file    Non-medical: Not on file  Tobacco Use  . Smoking status: Never Smoker  . Smokeless tobacco: Never Used  Substance and Sexual Activity  . Alcohol use: No  . Drug use: No  . Sexual activity: Yes    Birth control/protection: Pill  Lifestyle  . Physical activity:    Days  per week: Not on file    Minutes per session: Not on file  . Stress: Not on file  Relationships  . Social connections:    Talks on phone: Not on file    Gets together: Not on file    Attends religious service: Not on file    Active member of club or organization: Not on file    Attends meetings of clubs or organizations: Not on file    Relationship status: Not on file  Other Topics Concern  . Not on file  Social History Narrative   Pt lives in 1 story home with her husband   Has 1 step child who does not reside with pt   Some college   Nurse, children's.    Past Surgical History:  Procedure Laterality Date  . APPENDECTOMY    . KIDNEY STONE SURGERY     Past Medical History:  Diagnosis Date  . Asthma   . Benign tumor of pituitary gland ([place])   . Chronic back  pain   . Chronic pain   . Fibromyalgia   . Migraine   . PTSD (post-traumatic stress disorder)   . Seizures ([place])   . Traumatic brain injury ([place])    BP 121/81   Pulse (!) 104   Ht 5\' 2"  (1.575 m)   Wt 213 lb 3.2 oz (96.7 kg)   SpO2 96%   BMI 38.99 kg/m   Opioid Risk Score:   Fall Risk Score:  `1  Depression screen PHQ 2/9  Depression screen [facility] 2/9 04/14/2018 01/14/2018 12/19/2017 11/19/2017 09/30/2017 07/03/2017 06/10/2017  Decreased Interest 0 0 0 0 0 1 1  Down, Depressed, Hopeless 0 0 0 0 0 0 0  PHQ - 2 Score 0 0 0 0 0 1 1  Altered sleeping - - - - - - 3  Tired, decreased energy - - - - - - 3  Change in appetite - - - - - - 2  Feeling bad or failure about yourself  - - - - - - 1  Trouble concentrating - - - - - - 0  Moving slowly or fidgety/restless - - - - - - 1  Suicidal thoughts - - - - - - 0  PHQ-9 Score - - - - - - 11  Difficult doing work/chores - - - - - - Somewhat difficult    Review of Systems  Constitutional: Positive for chills, diaphoresis and unexpected weight change.  HENT: Negative.   Eyes: Negative.   Respiratory: Positive for shortness of breath.   Cardiovascular: Negative.   Gastrointestinal: Negative.   Endocrine: Negative.   Genitourinary: Negative.   Musculoskeletal: Negative.   Skin: Negative.   Allergic/Immunologic: Negative.   Neurological: Negative.   Hematological: Negative.   Psychiatric/Behavioral: Negative.   All other systems reviewed and are negative.      Objective:   Physical Exam   General: No acute distress HEENT: EOMI, oral membranes moist Cards: reg rate  Chest: normal effort Abdomen: Soft, NT, ND Skin: dry, intact Extremities: no edema   Skin:Clean and intact without signs of breakdown Neuro:cognitively she seems generally appropriate. No focal CN signs or motor/sensory deficits.  Musculoskeletal: patient displays functional cervical and lumbar range of motion.  Has generalized tenderness in established tender  point areas. Psych:Bright and pleasant        1.Chronic pain syndrome most consistent with fibromyalgia 2. History of PTSD 3. History of multiple brain traumas/concussions 4. Chronic migraines  5. Seizure disorder 6. Chronic back pain 7. Pituitary tumor 8. Labile blood pressure and heart rate suggestive of autonomic dysfunction/POTS 9. Recent weight gain/obesity  Plan: 1.Continue with exercises and vocational efforts as she is doing.  I think this helps her pain quite a bit. 2. For sleep and for pain, continue elavil 75mg . She may continue with trazodone at the current 100 mg nightly dose.   3.   Recommended that she reduce her Cymbalta to 30 mg daily.  This may help with her tachycardia and had hypertension associated with activity. 4. Seizure headache management per neurology.  5. POTS follow-up as recommended by cardiology  6. Refilled nucynta ER 50mg  and percocet today, #60, #100 respectively. We will continue the controlled substance monitoring program, this consists of regular clinic visits, examinations, routine drug screening, pill counts as well as use of [place] Controlled Substance Reporting System. [place] today.  Medication was refilled and a second prescription was sent to the patient's pharmacy for next month.    15 minutes of face to face patient care time were spent during this visit. All questions were encouraged and answered. Follow up with me or NP in 2 months

## 2018-05-05 ENCOUNTER — Other Ambulatory Visit: Payer: Self-pay | Admitting: Physician Assistant

## 2018-05-05 DIAGNOSIS — Z30019 Encounter for initial prescription of contraceptives, unspecified: Secondary | ICD-10-CM

## 2018-06-02 ENCOUNTER — Encounter: Payer: BLUE CROSS/BLUE SHIELD | Attending: Physical Medicine & Rehabilitation | Admitting: Registered Nurse

## 2018-06-02 ENCOUNTER — Encounter: Payer: Self-pay | Admitting: Registered Nurse

## 2018-06-02 ENCOUNTER — Other Ambulatory Visit: Payer: Self-pay

## 2018-06-02 VITALS — BP 128/82 | HR 86 | Ht 62.0 in | Wt 213.4 lb

## 2018-06-02 DIAGNOSIS — Z813 Family history of other psychoactive substance abuse and dependence: Secondary | ICD-10-CM | POA: Insufficient documentation

## 2018-06-02 DIAGNOSIS — G43909 Migraine, unspecified, not intractable, without status migrainosus: Secondary | ICD-10-CM | POA: Insufficient documentation

## 2018-06-02 DIAGNOSIS — M5416 Radiculopathy, lumbar region: Secondary | ICD-10-CM | POA: Diagnosis not present

## 2018-06-02 DIAGNOSIS — G8929 Other chronic pain: Secondary | ICD-10-CM | POA: Diagnosis present

## 2018-06-02 DIAGNOSIS — G894 Chronic pain syndrome: Secondary | ICD-10-CM | POA: Diagnosis not present

## 2018-06-02 DIAGNOSIS — M797 Fibromyalgia: Secondary | ICD-10-CM | POA: Diagnosis not present

## 2018-06-02 DIAGNOSIS — Z87820 Personal history of traumatic brain injury: Secondary | ICD-10-CM

## 2018-06-02 DIAGNOSIS — Z8249 Family history of ischemic heart disease and other diseases of the circulatory system: Secondary | ICD-10-CM | POA: Insufficient documentation

## 2018-06-02 DIAGNOSIS — M549 Dorsalgia, unspecified: Secondary | ICD-10-CM | POA: Insufficient documentation

## 2018-06-02 DIAGNOSIS — Z79899 Other long term (current) drug therapy: Secondary | ICD-10-CM | POA: Diagnosis not present

## 2018-06-02 DIAGNOSIS — J45909 Unspecified asthma, uncomplicated: Secondary | ICD-10-CM | POA: Diagnosis not present

## 2018-06-02 DIAGNOSIS — E669 Obesity, unspecified: Secondary | ICD-10-CM | POA: Insufficient documentation

## 2018-06-02 DIAGNOSIS — G40909 Epilepsy, unspecified, not intractable, without status epilepticus: Secondary | ICD-10-CM | POA: Insufficient documentation

## 2018-06-02 DIAGNOSIS — F4310 Post-traumatic stress disorder, unspecified: Secondary | ICD-10-CM | POA: Insufficient documentation

## 2018-06-02 DIAGNOSIS — Z5181 Encounter for therapeutic drug level monitoring: Secondary | ICD-10-CM

## 2018-06-02 DIAGNOSIS — R0989 Other specified symptoms and signs involving the circulatory and respiratory systems: Secondary | ICD-10-CM | POA: Diagnosis not present

## 2018-06-02 DIAGNOSIS — D497 Neoplasm of unspecified behavior of endocrine glands and other parts of nervous system: Secondary | ICD-10-CM | POA: Insufficient documentation

## 2018-06-02 MED ORDER — OXYCODONE-ACETAMINOPHEN 5-325 MG PO TABS: 1.0000 | ORAL_TABLET | Freq: Three times a day (TID) | ORAL | 0 refills | Status: DC | PRN

## 2018-06-02 MED ORDER — TAPENTADOL HCL ER 50 MG PO TB12: 50.0000 mg | ORAL_TABLET | Freq: Two times a day (BID) | ORAL | 0 refills | Status: DC

## 2018-06-02 NOTE — Progress Notes (Signed)
Subjective:    Patient ID: Kristine Kelley, female    DOB: 1990-08-15, 27 y.o.   MRN: [id number]  HPI: [name] is a 27 year old female who returns for follow up appointment for chronic pain and medication refill. She states her pain is located in her mid thoracic pain with tingling and burning. She rates her pain 7. Her current exercise regime is walking, riding her recumbent bike for 30 minutes 3-4 days a week.   [name] also states they will be moving to [place] in a few months, she will keep office posted.  [name] Morphine Equivalent is 65.00 MME. Last Oral Swab was Performed  On 03/23/2018, it was consistent.   Pain Inventory Average Pain 5 Pain Right Now 7 My pain is burning, dull and aching  In the last 24 hours, has pain interfered with the following? General activity 5 Relation with others 5 Enjoyment of life 5 What TIME of day is your pain at its worst? morning and night Sleep (in general) Good  Pain is worse with: walking, inactivity, standing and some activites Pain improves with: rest, heat/ice, therapy/exercise and medication Relief from Meds: 7  Mobility walk without assistance how many minutes can you walk? 30 ability to climb steps?  yes do you drive?  yes  Function employed # of hrs/week 25 I need assistance with the following:  meal prep and household duties  Neuro/Psych No problems in this area  Prior Studies Any changes since last visit?  no  Physicians involved in your care Any changes since last visit?  no   Family History  Problem Relation Age of Onset  . Heart disease Mother   . Cancer Mother   . Drug abuse Mother   . Heart disease Father   . Drug abuse Father   . Asthma Brother   . Allergic rhinitis Neg Hx   . Eczema Neg Hx   . Urticaria Neg Hx   . Immunodeficiency Neg Hx   . Angioedema Neg Hx    Social History   Socioeconomic History  . Marital status: Married    Spouse name: Not on file  . Number of children:  Not on file  . Years of education: Not on file  . Highest education level: Not on file  Occupational History  . Not on file  Social Needs  . Financial resource strain: Not on file  . Food insecurity:    Worry: Not on file    Inability: Not on file  . Transportation needs:    Medical: Not on file    Non-medical: Not on file  Tobacco Use  . Smoking status: Never Smoker  . Smokeless tobacco: Never Used  Substance and Sexual Activity  . Alcohol use: No  . Drug use: No  . Sexual activity: Yes    Birth control/protection: Pill  Lifestyle  . Physical activity:    Days per week: Not on file    Minutes per session: Not on file  . Stress: Not on file  Relationships  . Social connections:    Talks on phone: Not on file    Gets together: Not on file    Attends religious service: Not on file    Active member of club or organization: Not on file    Attends meetings of clubs or organizations: Not on file    Relationship status: Not on file  Other Topics Concern  . Not on file  Social History Narrative  Pt lives in 1 story home with her husband   Has 1 step child who does not reside with pt   Some college   Nurse, children's.    Past Surgical History:  Procedure Laterality Date  . APPENDECTOMY    . KIDNEY STONE SURGERY     Past Medical History:  Diagnosis Date  . Asthma   . Benign tumor of pituitary gland ([place])   . Chronic back pain   . Chronic pain   . Fibromyalgia   . Migraine   . PTSD (post-traumatic stress disorder)   . Seizures ([place])   . Traumatic brain injury ([place])    BP 128/82   Pulse 86   Ht 5\' 2"  (1.575 m)   Wt 213 lb 6.4 oz (96.8 kg)   SpO2 96%   BMI 39.03 kg/m   Opioid Risk Score:   Fall Risk Score:  `1  Depression screen PHQ 2/9  Depression screen [facility] 2/9 06/02/2018 04/14/2018 01/14/2018 12/19/2017 11/19/2017 09/30/2017 07/03/2017  Decreased Interest 0 0 0 0 0 0 1  Down, Depressed, Hopeless 0 0 0 0 0 0 0  PHQ - 2 Score 0 0 0 0 0  0 1  Altered sleeping - - - - - - -  Tired, decreased energy - - - - - - -  Change in appetite - - - - - - -  Feeling bad or failure about yourself  - - - - - - -  Trouble concentrating - - - - - - -  Moving slowly or fidgety/restless - - - - - - -  Suicidal thoughts - - - - - - -  PHQ-9 Score - - - - - - -  Difficult doing work/chores - - - - - - -   Review of Systems  Constitutional: Positive for diaphoresis.  HENT: Negative.   Eyes: Negative.   Respiratory: Negative.   Cardiovascular: Negative.   Gastrointestinal: Negative.   Endocrine: Negative.   Genitourinary: Negative.   Musculoskeletal: Negative.   Skin: Negative.   Allergic/Immunologic: Negative.   Neurological: Negative.   Hematological: Negative.   Psychiatric/Behavioral: Negative.   All other systems reviewed and are negative.      Objective:   Physical Exam  Constitutional: She is oriented to person, place, and time. She appears well-developed and well-nourished.  HENT:  Head: Normocephalic and atraumatic.  Neck: Normal range of motion. Neck supple.  Cardiovascular: Normal rate and regular rhythm.  Pulmonary/Chest: Effort normal and breath sounds normal.  Musculoskeletal:  Normal Muscle Bulk and Muscle Testing Reveals: Upper Extremities: Full ROM and Muscle Strength 5/5 Thoracic Paraspinal Tenderness: T-7-T-9 Lumbar Paraspinal Tenderness: L-3--4 Lower Extremities: Full ROM and Muscle Strength 5/5 Arises from Table with  Ease Narrow Based Gait   Neurological: She is alert and oriented to person, place, and time.  Skin: Skin is warm and dry.  Psychiatric: She has a normal mood and affect. Her behavior is normal.  Nursing note and vitals reviewed.         Assessment & Plan:  1. Chronic Pain Syndrome:Continue current medication regimen: Refilled:Tapentadol 50 mg ER Q 12 hours #60andOxycodone 5/325 mg one tablet every 8 hours as needed #100.06/02/2018 2.Fibromyalgia:Continue HEP as  Toleratedand continue current medication regimen with amitriptyline.06/02/2018 3. History of PTSD: Continue Counseling/ Awaiting Psychiatry appointment/ [facility] Pamphlet Given. 06/02/2018 4. History TBI: Continue to Monitor. 06/02/2018 5. Migraine with Aura: Continue Fiorcet . 06/02/2018 6. Labile Blood Pressure: Blood Pressure Stable  Today:Cardiology Following. 06/02/2018 7. ? POTS: Cardiology Following.06/02/2018 9. Lumbar Radiculitis:Continue current medication regimen with: Elavil.. 06/02/2018 10. Muscle Spasm: Continuecurrent medication regimen withFlexeril. 06/02/2018 11. Depression: Continue Cymbalta: PCP Following. Awaiting Psychiatric appointment. 06/02/2018. 12. Chronic Bilateral Thoracic Back Pain: Continue current medication regimen. Continue to monitor. 06/02/2018.   53minutes of face to face patient care time was spent during this visit. All questions were encouraged and answered.  F/U in 1 month

## 2018-06-10 ENCOUNTER — Ambulatory Visit: Payer: BLUE CROSS/BLUE SHIELD | Admitting: Registered Nurse

## 2018-06-10 ENCOUNTER — Other Ambulatory Visit: Payer: Self-pay | Admitting: Physician Assistant

## 2018-06-10 DIAGNOSIS — J3089 Other allergic rhinitis: Secondary | ICD-10-CM

## 2018-06-10 NOTE — Telephone Encounter (Signed)
Copied from [place] [id number]. Topic: Quick Communication - Rx Refill/Question >> Jun 10, 2018  1:46 PM Waylan Rocher, Lumin L wrote: Medication: montelukast (SINGULAIR) 10 MG tablet   Has the patient contacted their pharmacy? Yes.   (Agent: If no, request that the patient contact the pharmacy for the refill.) (Agent: If yes, when and what did the pharmacy advise?)0 refills  Preferred Pharmacy (with phone number or street name): [address], [place] - [place], [place] - [address] [place] [address] Phone: [phone] Fax: [phone]  Agent: Please be advised that RX refills may take up to 3 business days. We ask that you follow-up with your pharmacy.

## 2018-06-10 NOTE — Telephone Encounter (Signed)
Requested medication (s) are due for refill today: yes  Requested medication (s) are on the active medication list: yes  Last refill:  06/18/17 #30 5 RF  Future visit scheduled: No  Notes to clinic:  Last prescribed by Dr Starling Manns at [facility]   Requested Prescriptions  Pending Prescriptions Disp Refills   montelukast (SINGULAIR) 10 MG tablet 30 tablet 5    Sig: Take 1 tablet (10 mg total) by mouth at bedtime.     Pulmonology:  Leukotriene Inhibitors Passed - 06/10/2018  1:48 PM      Passed - Valid encounter within last 12 months    Recent Outpatient Visits          8 months ago Lymphadenopathy   Primary Care at [facility], Gelene Mink, PA-C   1 year ago Intractable migraine with aura without status migrainosus   Primary Care at [facility], Gelene Mink, PA-C   1 year ago Uncontrolled hypertension   Primary Care at [facility], Gelene Mink, PA-C   1 year ago Essential hypertension   Primary Care at [facility], Gelene Mink, PA-C   1 year ago Essential hypertension   Primary Care at [facility], [place], [place]

## 2018-06-17 ENCOUNTER — Other Ambulatory Visit: Payer: Self-pay | Admitting: Allergy and Immunology

## 2018-06-17 DIAGNOSIS — J3089 Other allergic rhinitis: Secondary | ICD-10-CM

## 2018-06-19 ENCOUNTER — Other Ambulatory Visit: Payer: Self-pay

## 2018-06-19 ENCOUNTER — Other Ambulatory Visit: Payer: Self-pay | Admitting: Allergy and Immunology

## 2018-06-19 ENCOUNTER — Ambulatory Visit: Payer: BLUE CROSS/BLUE SHIELD | Admitting: Family Medicine

## 2018-06-19 ENCOUNTER — Encounter: Payer: Self-pay | Admitting: Family Medicine

## 2018-06-19 ENCOUNTER — Telehealth: Payer: Self-pay | Admitting: Emergency Medicine

## 2018-06-19 VITALS — BP 140/97 | HR 88 | Temp 98.7°F | Resp 18 | Ht 62.21 in | Wt 215.0 lb

## 2018-06-19 DIAGNOSIS — M7918 Myalgia, other site: Secondary | ICD-10-CM

## 2018-06-19 DIAGNOSIS — F4310 Post-traumatic stress disorder, unspecified: Secondary | ICD-10-CM | POA: Diagnosis not present

## 2018-06-19 DIAGNOSIS — J4530 Mild persistent asthma, uncomplicated: Secondary | ICD-10-CM

## 2018-06-19 DIAGNOSIS — Z87820 Personal history of traumatic brain injury: Secondary | ICD-10-CM

## 2018-06-19 DIAGNOSIS — M5126 Other intervertebral disc displacement, lumbar region: Secondary | ICD-10-CM

## 2018-06-19 DIAGNOSIS — J3089 Other allergic rhinitis: Secondary | ICD-10-CM

## 2018-06-19 DIAGNOSIS — G8929 Other chronic pain: Secondary | ICD-10-CM

## 2018-06-19 DIAGNOSIS — H1013 Acute atopic conjunctivitis, bilateral: Secondary | ICD-10-CM

## 2018-06-19 DIAGNOSIS — G894 Chronic pain syndrome: Secondary | ICD-10-CM

## 2018-06-19 DIAGNOSIS — M5030 Other cervical disc degeneration, unspecified cervical region: Secondary | ICD-10-CM

## 2018-06-19 MED ORDER — FLUTICASONE PROPIONATE HFA 110 MCG/ACT IN AERO: 2.0000 | INHALATION_SPRAY | Freq: Two times a day (BID) | RESPIRATORY_TRACT | 3 refills | Status: AC

## 2018-06-19 MED ORDER — EPINEPHRINE 0.3 MG/0.3ML IJ SOAJ: 0.3000 mg | Freq: Once | INTRAMUSCULAR | 1 refills | Status: AC

## 2018-06-19 MED ORDER — MONTELUKAST SODIUM 10 MG PO TABS: 10.0000 mg | ORAL_TABLET | Freq: Every day | ORAL | 3 refills | Status: AC

## 2018-06-19 MED ORDER — LEVOCETIRIZINE DIHYDROCHLORIDE 5 MG PO TABS: 5.0000 mg | ORAL_TABLET | Freq: Every day | ORAL | 3 refills | Status: AC | PRN

## 2018-06-19 MED ORDER — ALBUTEROL SULFATE HFA 108 (90 BASE) MCG/ACT IN AERS: 2.0000 | INHALATION_SPRAY | RESPIRATORY_TRACT | 2 refills | Status: AC | PRN

## 2018-06-19 MED ORDER — DULOXETINE HCL 30 MG PO CPEP: 30.0000 mg | ORAL_CAPSULE | Freq: Every day | ORAL | 3 refills | Status: AC

## 2018-06-19 NOTE — Patient Instructions (Addendum)
     If you have lab work done today you will be contacted with your lab results within the next 2 weeks.  If you have not heard from us then please contact us. The fastest way to get your results is to register for My Chart.   IF you received an x-ray today, you will receive an invoice from [facility]. Please contact [facility] at [phone] with questions or concerns regarding your invoice.   IF you received labwork today, you will receive an invoice from [facility]. Please contact [facility] at [phone] with questions or concerns regarding your invoice.   Our billing staff will not be able to assist you with questions regarding bills from these companies.  You will be contacted with the lab results as soon as they are available. The fastest way to get your results is to activate your My Chart account. Instructions are located on the last page of this paperwork. If you have not heard from us regarding the results in 2 weeks, please contact this office.        If you have lab work done today you will be contacted with your lab results within the next 2 weeks.  If you have not heard from us then please contact us. The fastest way to get your results is to register for My Chart.   IF you received an x-ray today, you will receive an invoice from [facility]. Please contact [facility] at [phone] with questions or concerns regarding your invoice.   IF you received labwork today, you will receive an invoice from [facility]. Please contact [facility] at [phone] with questions or concerns regarding your invoice.   Our billing staff will not be able to assist you with questions regarding bills from these companies.  You will be contacted with the lab results as soon as they are available. The fastest way to get your results is to activate your My Chart account. Instructions are located on the last page of this paperwork. If you have not heard from us  regarding the results in 2 weeks, please contact this office.     

## 2018-06-19 NOTE — Telephone Encounter (Signed)
Pt seen today in [place] for this

## 2018-06-19 NOTE — Telephone Encounter (Signed)
Copied from [place] [id number]. Topic: Quick Communication - Rx Refill/Question >> Jun 10, 2018  1:46 PM Kristine Kelley, Kristine Kelley wrote: Medication: montelukast (SINGULAIR) 10 MG tablet   Has the patient contacted their pharmacy? Yes.   (Agent: If no, request that the patient contact the pharmacy for the refill.) (Agent: If yes, when and what did the pharmacy advise?)0 refills  Preferred Pharmacy (with phone number or street name): [address], [place] - [place], [place] - [address] [place] [address] Phone: [phone] Fax: [phone]  Agent: Please be advised that RX refills may take up to 3 business days. We ask that you follow-up with your pharmacy. >> Jun 19, 2018  9:36 AM Kristine Kelley Kelley wrote: Patient made an appt with Dr Mitchel Honour for December 21st. That was the next available. She is currently out out of her singular and really needs this for asthma. Patient's husband, Kristine Kelley is requesting a one time refill until her appointment because she can not go without it. He would like a call back either way [phone] Kristine Kelley).

## 2018-06-19 NOTE — Progress Notes (Signed)
Subjective:    Patient: Kristine Kelley  DOB: 1990-08-07; 27 y.o.   MRN: [id number]  Chief Complaint  Patient presents with  . Medication Refill    Xyzal and singulair  . Asthma    f/u    HPI  Moving to outside of [place] - to be near husband's family. Husbands epilepsy has been po Has been out of the medication x 2 weeks so stuffy nose, scratchy throat. Has been using cetirizine today (it was a dog's) and has been using bendaryl  Medical History Past Medical History:  Diagnosis Date  . Asthma   . Benign tumor of pituitary gland ([place])   . Chronic back pain   . Chronic pain   . Fibromyalgia   . Migraine   . PTSD (post-traumatic stress disorder)   . Seizures ([place])   . Traumatic brain injury [facility])    Past Surgical History:  Procedure Laterality Date  . APPENDECTOMY    . KIDNEY STONE SURGERY     Current Outpatient Medications on File Prior to Visit  Medication Sig Dispense Refill  . albuterol (PROVENTIL HFA;VENTOLIN HFA) 108 (90 Base) MCG/ACT inhaler Inhale 2 puffs into the lungs every 4 (four) hours as needed for wheezing or shortness of breath (cough, shortness of breath or wheezing.). 1 Inhaler 2  . amitriptyline (ELAVIL) 75 MG tablet Take 1 tablet (75 mg total) by mouth at bedtime. 30 tablet 5  . APRI 0.15-30 MG-MCG tablet TAKE 1 TABLET BY MOUTH EVERY DAY 28 tablet 2  . butalbital-acetaminophen-caffeine (FIORICET, ESGIC) 50-325-40 MG tablet As directed  0  . cyclobenzaprine (FLEXERIL) 10 MG tablet TAKE 1 TABLET BY MOUTH 3 TIMES DAILY AS NEEDED 60 tablet 3  . diclofenac (VOLTAREN) 50 MG EC tablet TAKE 1 TABLET BY MOUTH AT ONSET OF migraine. DO not TAKE more THAN 2 TO 3 TIMES PER WEEK 10 tablet 4  . Diclofenac Potassium (CAMBIA) 50 MG PACK Use 1 packet at onset of migraine. Do not use more than 3 times a week 9 each 5  . DULoxetine (CYMBALTA) 30 MG capsule TAKE 3 CAPSULES BY MOUTH EVERY DAY 90 capsule 3  . EPINEPHrine 0.15 MG/0.15ML IJ injection Inject 0.15 mLs (0.15 mg  total) into the muscle as needed for anaphylaxis. 2 Device 0  . fluticasone (FLONASE) 50 MCG/ACT nasal spray Use 1 spray per nostril 1-2 times daily as needed 16 g 5  . fluticasone (FLOVENT HFA) 110 MCG/ACT inhaler Inhale 2 puffs into the lungs 2 (two) times daily. During asthma flares. Use with spacer device. 1 Inhaler 5  . hydrALAZINE (APRESOLINE) 10 MG tablet Take 10 mg by mouth as directed.    . hydrALAZINE (APRESOLINE) 10 MG tablet TAKE 2 TABLETS BY MOUTH 3 TIMES DAILY AS NEEDED 180 tablet 3  . ibuprofen (ADVIL,MOTRIN) 800 MG tablet Take 800 mg by mouth 2 (two) times daily as needed.    . lamoTRIgine (LAMICTAL) 100 MG tablet Take 1 tablet (100 mg total) daily by mouth. Take 1 tab in AM, 2 tabs in PM 90 tablet 6  . levocetirizine (XYZAL) 5 MG tablet Take 1 tablet (5 mg total) by mouth daily as needed for allergies. 30 tablet 5  . metoprolol tartrate (LOPRESSOR) 50 MG tablet TAKE 1 TABLET BY MOUTH 2 TIMES DAILY. NEED OFFICE VISIT 60 tablet 5  . montelukast (SINGULAIR) 10 MG tablet Take 1 tablet (10 mg total) by mouth at bedtime. 30 tablet 5  . NARCAN 4 MG/0.1ML LIQD nasal spray kit As directed  0  . Olopatadine HCl (PAZEO) 0.7 % SOLN Place 1 drop into both eyes daily as needed. 1 Bottle 5  . Olopatadine HCl 0.2 % SOLN Use one drop each eye once daily. 2.5 mL 5  . ondansetron (ZOFRAN) 4 MG tablet Take 4 mg by mouth every 8 (eight) hours as needed for nausea or vomiting.    . ondansetron (ZOFRAN-ODT) 4 MG disintegrating tablet Take 1 tablet by mouth every 8 (eight) hours as needed.    Marland Kitchen oxyCODONE-acetaminophen (PERCOCET/ROXICET) 5-325 MG tablet Take 1 tablet by mouth every 8 (eight) hours as needed for severe pain. may take an extra tablet when pain is severe 10 days out the month 100 tablet 0  . rizatriptan (MAXALT) 10 MG tablet As directed  0  . rizatriptan (MAXALT) 10 MG tablet Take 1 tablet by mouth as needed for migraine. May repeat in 2 hours if needed. Max dose 36m/day 30 tablet 1  .  tapentadol (NUCYNTA) 50 MG 12 hr tablet Take 1 tablet (50 mg total) by mouth every 12 (twelve) hours. 60 tablet 0  . traZODone (DESYREL) 100 MG tablet TAKE 1 TABLET BY MOUTH AT BEDTIME AS NEEDED FOR SLEEP 60 tablet 1   No current facility-administered medications on file prior to visit.    Allergies  Allergen Reactions  . Ambien [Zolpidem Tartrate] Other (See Comments)    PTSD, nightmares   . Dilantin [Phenytoin]     Intravenously - it burns   . Naproxen Hives   Family History  Problem Relation Age of Onset  . Heart disease Mother   . Cancer Mother   . Drug abuse Mother   . Heart disease Father   . Drug abuse Father   . Asthma Brother   . Allergic rhinitis Neg Hx   . Eczema Neg Hx   . Urticaria Neg Hx   . Immunodeficiency Neg Hx   . Angioedema Neg Hx    Social History   Socioeconomic History  . Marital status: Married    Spouse name: Not on file  . Number of children: 0  . Years of education: Not on file  . Highest education level: Not on file  Occupational History  . Not on file  Social Needs  . Financial resource strain: Not on file  . Food insecurity:    Worry: Not on file    Inability: Not on file  . Transportation needs:    Medical: Not on file    Non-medical: Not on file  Tobacco Use  . Smoking status: Never Smoker  . Smokeless tobacco: Never Used  Substance and Sexual Activity  . Alcohol use: No  . Drug use: No  . Sexual activity: Yes    Birth control/protection: Pill  Lifestyle  . Physical activity:    Days per week: Not on file    Minutes per session: Not on file  . Stress: Not on file  Relationships  . Social connections:    Talks on phone: Not on file    Gets together: Not on file    Attends religious service: Not on file    Active member of club or organization: Not on file    Attends meetings of clubs or organizations: Not on file    Relationship status: Not on file  Other Topics Concern  . Not on file  Social History Narrative   Pt  lives in 1 story home with her husband   Has 1 step child who does not reside with  pt   Some Teaching laboratory technician.    Depression screen [facility] 2/9 06/19/2018 06/02/2018 04/14/2018 01/14/2018 12/19/2017  Decreased Interest 0 0 0 0 0  Down, Depressed, Hopeless 0 0 0 0 0  PHQ - 2 Score 0 0 0 0 0  Altered sleeping - - - - -  Tired, decreased energy - - - - -  Change in appetite - - - - -  Feeling bad or failure about yourself  - - - - -  Trouble concentrating - - - - -  Moving slowly or fidgety/restless - - - - -  Suicidal thoughts - - - - -  PHQ-9 Score - - - - -  Difficult doing work/chores - - - - -    ROS As noted in HPI  Objective:  BP (!) 140/97   Pulse 88   Temp 98.7 F (37.1 C) (Oral)   Resp 18   Ht 5' 2.21" (1.58 m)   Wt 215 lb (97.5 kg)   LMP 06/04/2018 (Approximate)   SpO2 96%   BMI 39.07 kg/m  Physical Exam Constitutional:      General: She is not in acute distress.    Appearance: She is well-developed. She is not diaphoretic.  HENT:     Head: Normocephalic and atraumatic.     Right Ear: External ear normal.     Left Ear: External ear normal.  Eyes:     General: No scleral icterus.    Conjunctiva/sclera: Conjunctivae normal.  Neck:     Musculoskeletal: Normal range of motion and neck supple.     Thyroid: No thyromegaly.  Cardiovascular:     Rate and Rhythm: Normal rate and regular rhythm.     Heart sounds: Normal heart sounds.  Pulmonary:     Effort: Pulmonary effort is normal. No respiratory distress.     Breath sounds: Normal breath sounds.  Lymphadenopathy:     Cervical: No cervical adenopathy.  Skin:    General: Skin is warm and dry.     Findings: No erythema.  Neurological:     Mental Status: She is alert and oriented to person, place, and time.  Psychiatric:        Behavior: Behavior normal.     POC TESTING No visits with results within 3 Day(s) from this visit.  Latest known visit with results is:  Office Visit  on 03/23/2018  Component Date Value Ref Range Status  . Amphetamines 03/23/2018 NEGATIVE  <10 ng/mL Final  . Barbiturates 03/23/2018 NEGATIVE  <10 ng/mL Final  . Benzodiazepines 03/23/2018 NEGATIVE  <0.50 ng/mL Final  . Buprenorphine 03/23/2018 NEGATIVE  <0.10 ng/mL Final  . Cocaine 03/23/2018 NEGATIVE  <5.0 ng/mL Final  . Fentanyl 03/23/2018 NEGATIVE  <0.10 ng/mL Final  . Heroin Metabolite 03/23/2018 NEGATIVE  <1.0 ng/mL Final  . MARIJUANA 03/23/2018 NEGATIVE  <2.5 ng/mL Final  . MDMA 03/23/2018 NEGATIVE  <10 ng/mL Final  . Meprobamate 03/23/2018 NEGATIVE  <2.5 ng/mL Final  . Methadone 03/23/2018 NEGATIVE  <5.0 ng/mL Final  . Nicotine Metabolite 03/23/2018 NEGATIVE  <5.0 ng/mL Final  . Opiates 03/23/2018 POSITIVE* <2.5 ng/mL Final  . Codeine 03/23/2018 Negative  <2.5 ng/mL Final  . Dihydrocodeine 03/23/2018 Negative  <2.5 ng/mL Final  . Hydrocodone 03/23/2018 Negative  <2.5 ng/mL Final  . Hydromorphone 03/23/2018 Negative  <2.5 ng/mL Final  . Morphine 03/23/2018 Negative  <2.5 ng/mL Final  . Norhydrocodone 03/23/2018 Negative  <2.5 ng/mL Final  . Noroxycodone 03/23/2018 4.8* <  2.5 ng/mL Final   Comment: . Noroxycodone is a metabolite of oxycodone.   . Oxycodone 03/23/2018 17.5* <2.5 ng/mL Final  . Oxymorphone 03/23/2018 Negative  <2.5 ng/mL Final  . Phencyclidine 03/23/2018 NEGATIVE  <10 ng/mL Final  . Tapentadol 03/23/2018 NEGATIVE  <5.0 ng/mL Final  . Tramadol 03/23/2018 NEGATIVE  <5.0 ng/mL Final  . Zolpidem 03/23/2018 NEGATIVE  <5.0 ng/mL Final   Comment: . For additional information, please refer to http://education.[facility].com/faq/FAQ186 (This link is being provided for informational/ educational purposes only.) . This drug testing is for medical treatment only. Analysis was performed as non-forensic testing and these results should be used only by healthcare providers to render diagnosis or treatment, or to monitor progress of medical conditions. . For  assistance with interpreting these drug results, please contact a [employer] Toxicology Specialist: [phone] [place] [phone]), M-F, 8am-6pm EST. Marland Kitchen These tests were developed and their analytical performance characteristics have been determined by [facility]. They have not been cleared or approved by the FDA. These assays have been validated pursuant to the CLIA regulations and are used for clinical purposes.   . Alcohol Metabolite 03/23/2018 NEGATIVE  <25 ng/mL Final   Comment: . For additional information, please refer to http://education.[facility].com/faq/FAQ183 (This link is being provided for informational/ educational purposes only.) . This drug testing is for medical treatment only. Analysis was performed as non-forensic testing and these results should be used only by healthcare providers to render diagnosis or treatment, or to monitor progress of medical conditions. . For assistance with interpreting these drug results, please contact a [employer] Toxicology Specialist: [phone] [place] [phone]), M-F, 8am-6pm EST. Marland Kitchen These tests were developed and their analytical performance characteristics have been determined by [facility]. They have not been cleared or approved by the FDA. These assays have been validated pursuant to the CLIA regulations and are used for clinical purposes.      Assessment & Plan:   1. Mild persistent asthma, unspecified whether complicated   2. Other chronic pain   3. Posttraumatic stress disorder   4. Other allergic rhinitis   5. Allergic conjunctivitis of both eyes   6. History of traumatic brain injury   7. DDD (degenerative disc disease), cervical   8. Displacement of lumbar intervertebral disc without myelopathy   9. Chronic pain syndrome   10. Myofascial pain     Ok to refill flonase and olopatadine eye drops whenever requested. Her epi-pen needs   Patient will continue on current  chronic medications other than changes noted above, so ok to refill when needed.   See after visit summary for patient specific instructions.  Orders Placed This Encounter  Procedures  . Ambulatory referral to Physical Medicine Rehab    Referral Priority:   Routine    Referral Type:   Rehabilitation    Referral Reason:   Specialty Services Required    Requested Specialty:   Physical Medicine and Rehabilitation    Number of Visits Requested:   1    Meds ordered this encounter  Medications  . DULoxetine (CYMBALTA) 30 MG capsule    Sig: Take 1 capsule (30 mg total) by mouth daily.    Dispense:  90 capsule    Refill:  3    This prescription was filled on 04/09/2018. Any refills authorized will be placed on file.  . levocetirizine (XYZAL) 5 MG tablet    Sig: Take 1 tablet (5 mg total) by mouth daily as needed for allergies.    Dispense:  90  tablet    Refill:  3  . montelukast (SINGULAIR) 10 MG tablet    Sig: Take 1 tablet (10 mg total) by mouth at bedtime.    Dispense:  90 tablet    Refill:  3  . fluticasone (FLOVENT HFA) 110 MCG/ACT inhaler    Sig: Inhale 2 puffs into the lungs 2 (two) times daily. During asthma flares. Use with spacer device.    Dispense:  1 Inhaler    Refill:  3  . albuterol (PROVENTIL HFA;VENTOLIN HFA) 108 (90 Base) MCG/ACT inhaler    Sig: Inhale 2 puffs into the lungs every 4 (four) hours as needed for wheezing or shortness of breath (cough, shortness of breath or wheezing.).    Dispense:  1 Inhaler    Refill:  2  . EPINEPHrine 0.3 mg/0.3 mL IJ SOAJ injection    Sig: Inject 0.3 mLs (0.3 mg total) into the muscle once for 1 dose.    Dispense:  1 Device    Refill:  1    Patient verbalized to me that they understand the following: diagnosis, what is being done for them, what to expect and what should be done at home.  Their questions have been answered. They understand that I am unable to predict every possible medication interaction or adverse outcome and that  if any unexpected symptoms arise, they should contact us and their pharmacist, as well as never hesitate to seek urgent/emergent care at [facility] or ER if they think it might be warranted.    Delman Cheadle, MD, MPH [facility] [place], [place]  [address] [phone] Office phone  [phone] Office fax  06/19/18 1:12 PM

## 2018-07-01 ENCOUNTER — Ambulatory Visit: Payer: Self-pay

## 2018-07-01 ENCOUNTER — Other Ambulatory Visit: Payer: Self-pay | Admitting: Physician Assistant

## 2018-07-01 DIAGNOSIS — Z30019 Encounter for initial prescription of contraceptives, unspecified: Secondary | ICD-10-CM

## 2018-07-01 DIAGNOSIS — G43119 Migraine with aura, intractable, without status migrainosus: Secondary | ICD-10-CM

## 2018-07-01 DIAGNOSIS — G479 Sleep disorder, unspecified: Secondary | ICD-10-CM

## 2018-07-01 NOTE — Telephone Encounter (Signed)
Returned call to pt.  C/o Migraine headache she has had for one week.  Reported the headache has varied in intensity, and is "typical" for the Migraines she has experienced.  Rated pain at 5/10-8/10.  Took Maxalt this morning at 6:30 AM, and pain has slightly improved to 6/10 at present.  C/o intermittent blurring of vision and seeing stars.  C/o nausea; denied vomiting. C/o stiff neck, but able to touch chin to chest.  Denied fever/ chills.  C/o intermittent dizziness, but stated she has POTS, and that the dizziness may be related to that condition.  Stated she tried to schedule appt. With Neurologist, and was informed she would not be able to be seen before July.  Appt. Scheduled at PCP office at 8:00 AM 12/19.  Care advice given per protocol.  Verb. Understanding.  Knows to call back if symptoms worsen.          Reason for Disposition . [1] MILD-MODERATE headache AND [2] present > 72 hours  Answer Assessment - Initial Assessment Questions 1. LOCATION: "Where does it hurt?"      Mostly top and sides of head 2. ONSET: "When did the headache start?" (Minutes, hours or days)      One week  3. PATTERN: "Does the pain come and go, or has it been constant since it started?"     constant 4. SEVERITY: "How bad is the pain?" and "What does it keep you from doing?"  (e.g., Scale 1-10; mild, moderate, or severe)   - MILD (1-3): doesn't interfere with normal activities    - MODERATE (4-7): interferes with normal activities or awakens from sleep    - SEVERE (8-10): excruciating pain, unable to do any normal activities        5/10-8/10; currently a 6/10  5. RECURRENT SYMPTOM: "Have you ever had headaches before?" If so, ask: "When was the last time?" and "What happened that time?"      Maxalt and Zofran and Flexeril  6. CAUSE: "What do you think is causing the headache?"     Migraine 7. MIGRAINE: "Have you been diagnosed with migraine headaches?" If so, ask: "Is this headache similar?"      Yes as far as  the pain; not typically ongoing for one week  8. HEAD INJURY: "Has there been any recent injury to the head?"      Denied  9. OTHER SYMPTOMS: "Do you have any other symptoms?" (fever, stiff neck, eye pain, sore throat, cold symptoms)     Blurring vision and sees stars intermittently, stiff neck-can touch chin to her chest; has POTS; intermittent dizziness, c/o nausea   10. PREGNANCY: "Is there any chance you are pregnant?" "When was your last menstrual period?"       Currently on Menstrual cycle  Protocols used: HEADACHE-A-AH  Message from [name] sent at 07/01/2018 8:04 AM EST   Summary: migraines    Pt called and stated that she has been having migraines and would like to know some tips to help out. Please advise

## 2018-07-02 ENCOUNTER — Encounter: Payer: Self-pay | Admitting: Family Medicine

## 2018-07-02 ENCOUNTER — Other Ambulatory Visit: Payer: Self-pay

## 2018-07-02 ENCOUNTER — Telehealth: Payer: Self-pay

## 2018-07-02 ENCOUNTER — Encounter: Payer: Self-pay | Admitting: Registered Nurse

## 2018-07-02 ENCOUNTER — Ambulatory Visit: Payer: BLUE CROSS/BLUE SHIELD | Admitting: Family Medicine

## 2018-07-02 ENCOUNTER — Encounter: Payer: BLUE CROSS/BLUE SHIELD | Attending: Physical Medicine & Rehabilitation | Admitting: Registered Nurse

## 2018-07-02 VITALS — BP 141/87 | HR 89 | Resp 14 | Ht 61.0 in | Wt 218.0 lb

## 2018-07-02 VITALS — BP 137/93 | HR 88 | Temp 97.8°F | Resp 18 | Ht 62.21 in | Wt 214.2 lb

## 2018-07-02 DIAGNOSIS — M797 Fibromyalgia: Secondary | ICD-10-CM | POA: Diagnosis not present

## 2018-07-02 DIAGNOSIS — Z87820 Personal history of traumatic brain injury: Secondary | ICD-10-CM | POA: Diagnosis not present

## 2018-07-02 DIAGNOSIS — M5412 Radiculopathy, cervical region: Secondary | ICD-10-CM

## 2018-07-02 DIAGNOSIS — J45909 Unspecified asthma, uncomplicated: Secondary | ICD-10-CM | POA: Diagnosis not present

## 2018-07-02 DIAGNOSIS — G40909 Epilepsy, unspecified, not intractable, without status epilepticus: Secondary | ICD-10-CM | POA: Diagnosis not present

## 2018-07-02 DIAGNOSIS — G43909 Migraine, unspecified, not intractable, without status migrainosus: Secondary | ICD-10-CM | POA: Diagnosis not present

## 2018-07-02 DIAGNOSIS — M542 Cervicalgia: Secondary | ICD-10-CM

## 2018-07-02 DIAGNOSIS — Z8249 Family history of ischemic heart disease and other diseases of the circulatory system: Secondary | ICD-10-CM | POA: Diagnosis not present

## 2018-07-02 DIAGNOSIS — F4310 Post-traumatic stress disorder, unspecified: Secondary | ICD-10-CM | POA: Insufficient documentation

## 2018-07-02 DIAGNOSIS — G894 Chronic pain syndrome: Secondary | ICD-10-CM

## 2018-07-02 DIAGNOSIS — G43111 Migraine with aura, intractable, with status migrainosus: Secondary | ICD-10-CM

## 2018-07-02 DIAGNOSIS — G8929 Other chronic pain: Secondary | ICD-10-CM | POA: Diagnosis present

## 2018-07-02 DIAGNOSIS — M546 Pain in thoracic spine: Secondary | ICD-10-CM

## 2018-07-02 DIAGNOSIS — M5416 Radiculopathy, lumbar region: Secondary | ICD-10-CM | POA: Diagnosis not present

## 2018-07-02 DIAGNOSIS — E669 Obesity, unspecified: Secondary | ICD-10-CM | POA: Diagnosis not present

## 2018-07-02 DIAGNOSIS — R0989 Other specified symptoms and signs involving the circulatory and respiratory systems: Secondary | ICD-10-CM | POA: Diagnosis not present

## 2018-07-02 DIAGNOSIS — Z813 Family history of other psychoactive substance abuse and dependence: Secondary | ICD-10-CM | POA: Insufficient documentation

## 2018-07-02 DIAGNOSIS — D497 Neoplasm of unspecified behavior of endocrine glands and other parts of nervous system: Secondary | ICD-10-CM | POA: Diagnosis not present

## 2018-07-02 DIAGNOSIS — Z79899 Other long term (current) drug therapy: Secondary | ICD-10-CM | POA: Diagnosis not present

## 2018-07-02 DIAGNOSIS — G43109 Migraine with aura, not intractable, without status migrainosus: Secondary | ICD-10-CM

## 2018-07-02 DIAGNOSIS — Z5181 Encounter for therapeutic drug level monitoring: Secondary | ICD-10-CM

## 2018-07-02 DIAGNOSIS — M549 Dorsalgia, unspecified: Secondary | ICD-10-CM | POA: Insufficient documentation

## 2018-07-02 MED ORDER — OXYCODONE-ACETAMINOPHEN 5-325 MG PO TABS: 1.0000 | ORAL_TABLET | Freq: Three times a day (TID) | ORAL | 0 refills | Status: DC | PRN

## 2018-07-02 MED ORDER — METHOCARBAMOL 500 MG PO TABS: ORAL_TABLET | ORAL | 0 refills | Status: AC

## 2018-07-02 MED ORDER — CYCLOBENZAPRINE HCL 10 MG PO TABS: 10.0000 mg | ORAL_TABLET | Freq: Three times a day (TID) | ORAL | 3 refills | Status: AC | PRN

## 2018-07-02 MED ORDER — TAPENTADOL HCL ER 50 MG PO TB12: 50.0000 mg | ORAL_TABLET | Freq: Two times a day (BID) | ORAL | 0 refills | Status: DC

## 2018-07-02 NOTE — Telephone Encounter (Signed)
Pt called stating when she was in earlier today she spoke to [place] about her moving and she called [facility] in [place], [place] and they will take referrals. The office asked if a referral and medical records be faxed to [phone] and the phone # [phone]. She also found a pharmacy her medication can be sent to [facility] [phone]. She asked if Zella Ball could call to see if they was willing to accept an out of state prescription.

## 2018-07-02 NOTE — Progress Notes (Signed)
Patient ID: Kristine Kelley, female    DOB: 05/17/1991  Age: 27 y.o. MRN: [id number]  Chief Complaint  Patient presents with  . Migraine    kind of a follow up cant seem to get rid of them     Subjective:   27 year old lady with a long history of migraines.  She also has pots disease.  She is on long list of medications, and has seen a neurologist in the past.  However unfortunately MRI of this is not available until July.  She has migraines several times a month, but this 1 is easily and that is lasted over a week.  She has tried numerous things.  She is on multiple meds as reviewed in her list.  She gets nauseated.  She has a lot of pain up the back of her neck.  She does not take any street drugs.  Does not use alcohol to smoke.  She does do some work from home.  With the pots disease her blood pressure goes high and low.  She has seen a cardiologist for that and had electrophysiologic testing done.  Current allergies, medications, problem list, past/family and social histories reviewed.  Objective:  BP (!) 137/93   Pulse 88   Temp 97.8 F (36.6 C) (Oral)   Resp 18   Ht 5' 2.21" (1.58 m)   Wt 214 lb 3.2 oz (97.2 kg)   LMP 06/30/2018   SpO2 96%   BMI 38.91 kg/m   Alert and oriented.  Does not appear in major distress describes her pain being 6 out of 10.  She has eyes PRL.  EOMs intact.  Fundi benign.  TMs normal.  Throat clear.  Neck supple without nodes or tenderness.  Chest clear to auscultation.  Heart rate without murmurs.  Abdomen soft mass or tenderness.  Deep tendon reflexes brisk.  Coordination average.  She is a little tender actually in the back of the neck and upper back.  Assessment & Plan:   Assessment: 1. Intractable migraine with aura with status migrainosus   2. Cervical pain (neck)       Plan: It is difficult to know what to offer her due to the fact that she has been tried on everything.  I advised her to take the ibuprofen on a regular basis.  Contemplated  giving her some ketorolac but she has had hives from naproxen so I did not do so.  I feel like she might benefit from 1 of the newer migraine shots such as Aimovig, but she cannot get into her neurologist until July.  She asked if I could refer her to a different neurologist and I will do so.  If worse she should go to the emergency room.  A shot of Decadron might help her.45 min visit.  Orders Placed This Encounter  Procedures  . Ambulatory referral to Neurology    Referral Priority:   Routine    Referral Type:   Consultation    Referral Reason:   Second Opinion    Referred to Provider:   Larey Seat, MD    Requested Specialty:   Neurology    Number of Visits Requested:   1    Meds ordered this encounter  Medications  . methocarbamol (ROBAXIN) 500 MG tablet    Sig: Take one or two 3 times daily as needed for migraines muscle relaxant    Dispense:  40 tablet    Refill:  0  Patient Instructions      Take the ibuprofen 800 mg 3 times daily  Use the methocarbamol 1 or 2 pills (500 mg) every 6-8 hours as needed for upper back and neck pain.  Hold the Flexeril while on the methocarbamol.  Referral is being made to [facility], have asked for Dr. Brett Fairy if she is available.  There a re number of good neurologist in that group.  If acutely worse go to the emergency room. If you have lab work done today you will be contacted with your lab results within the next 2 weeks.  If you have not heard from [place] then please contact us. The fastest way to get your results is to register for My Chart.   IF you received an x-ray today, you will receive an invoice from [facility]. Please contact [facility] at [phone] with questions or concerns regarding your invoice.   IF you received labwork today, you will receive an invoice from [place]. Please contact [facility] at [phone] with questions or concerns regarding your invoice.    Our billing staff will not be able to assist you with questions regarding bills from these companies.  You will be contacted with the lab results as soon as they are available. The fastest way to get your results is to activate your My Chart account. Instructions are located on the last page of this paperwork. If you have not heard from [place] regarding the results in 2 weeks, please contact this office.        No follow-ups on file.   Ruben Reason, MD 07/02/2018

## 2018-07-02 NOTE — Progress Notes (Signed)
Subjective:    Patient ID: Kristine Kelley, female    DOB: 02/10/1991, 27 y.o.   MRN: [id number]  HPI: Kristine Kelley is a 27 y.o. female who returns for follow up appointment for chronic pain and medication refill. She states her pain is located in her neck radiating into her bilateral shoulders, and upper back. Also reports she's  having a migraine, it has lasted for 5 days. In the past she was prescribe Topamax and developed kidney stones, Maxalt and Fiorcet ineffective. She will discuss with Dr. Rudi Coco verses Botox, she verbalizes understanding.  Kristine Kelley asked about Botox, stating her neurologist suggested the Botox, this will be Dr. Naaman Plummer decision. She rates her pain 6. Her  current exercise regime is walking, performing stretching exercises and riding recumbent bicycle daily for 30 minutes.   Kristine Kelley equivalent is 65.00  MME.  Last Oral Swab was Performed on 03/23/2018, it was consistent.   Pain Inventory Average Pain 6 Pain Right Now 6 My pain is sharp, burning, dull and aching  In the last 24 hours, has pain interfered with the following? General activity 0 Relation with others 0 Enjoyment of life 0 What TIME of day is your pain at its worst? morning Sleep (in general) Fair  Pain is worse with: bending, sitting, inactivity and standing Pain improves with: rest, heat/ice, therapy/exercise and medication Relief from Meds: 6  Mobility walk without assistance ability to climb steps?  yes do you drive?  yes Do you have any goals in this area?  no  Function employed # of hrs/week . I need assistance with the following:  meal prep, household duties and shopping Do you have any goals in this area?  no  Neuro/Psych dizziness  Prior Studies Any changes since last visit?  no  Physicians involved in your care Any changes since last visit?  no   Family History  Problem Relation Age of Onset  . Heart disease Mother   . Cancer Mother   . Drug abuse Mother    . Heart disease Father   . Drug abuse Father   . Asthma Brother   . Allergic rhinitis Neg Hx   . Eczema Neg Hx   . Urticaria Neg Hx   . Immunodeficiency Neg Hx   . Angioedema Neg Hx    Social History   Socioeconomic History  . Marital status: Married    Spouse name: Not on file  . Number of children: 0  . Years of education: Not on file  . Highest education level: Not on file  Occupational History  . Not on file  Social Needs  . Financial resource strain: Not on file  . Food insecurity:    Worry: Not on file    Inability: Not on file  . Transportation needs:    Medical: Not on file    Non-medical: Not on file  Tobacco Use  . Smoking status: Never Smoker  . Smokeless tobacco: Never Used  Substance and Sexual Activity  . Alcohol use: No  . Drug use: No  . Sexual activity: Yes    Birth control/protection: Pill  Lifestyle  . Physical activity:    Days per week: Not on file    Minutes per session: Not on file  . Stress: Not on file  Relationships  . Social connections:    Talks on phone: Not on file    Gets together: Not on file    Attends religious service: Not on file  Active member of club or organization: Not on file    Attends meetings of clubs or organizations: Not on file    Relationship status: Not on file  Other Topics Concern  . Not on file  Social History Narrative   Pt lives in 1 story home with her husband   Has 1 step child who does not reside with pt   Some college   Nurse, children's.    Past Surgical History:  Procedure Laterality Date  . APPENDECTOMY    . KIDNEY STONE SURGERY     Past Medical History:  Diagnosis Date  . Asthma   . Benign tumor of pituitary gland ([place])   . Chronic back pain   . Chronic pain   . Fibromyalgia   . Migraine   . PTSD (post-traumatic stress disorder)   . Seizures ([place])   . Traumatic brain injury ([place])    BP (!) 141/87   Pulse 89   Resp 14   Ht 5\' 1"  (1.549 m)   Wt 218 lb  (98.9 kg)   LMP 06/30/2018   SpO2 96%   BMI 41.19 kg/m   Opioid Risk Score:   Fall Risk Score:  `1  Depression screen PHQ 2/9  Depression screen [facility] 2/9 07/02/2018 06/19/2018 06/02/2018 04/14/2018 01/14/2018 12/19/2017 11/19/2017  Decreased Interest 0 0 0 0 0 0 0  Down, Depressed, Hopeless 0 0 0 0 0 0 0  PHQ - 2 Score 0 0 0 0 0 0 0  Altered sleeping - - - - - - -  Tired, decreased energy - - - - - - -  Change in appetite - - - - - - -  Feeling bad or failure about yourself  - - - - - - -  Trouble concentrating - - - - - - -  Moving slowly or fidgety/restless - - - - - - -  Suicidal thoughts - - - - - - -  PHQ-9 Score - - - - - - -  Difficult doing work/chores - - - - - - -    Review of Systems  Constitutional: Negative.   HENT: Negative.   Eyes: Negative.   Respiratory: Negative.   Cardiovascular: Negative.   Gastrointestinal: Negative.   Endocrine: Negative.   Genitourinary: Negative.   Musculoskeletal: Positive for back pain and neck pain.  Skin: Negative.   Allergic/Immunologic: Negative.   Neurological: Positive for dizziness.  Hematological: Negative.   Psychiatric/Behavioral: Negative.        Objective:   Physical Exam Vitals signs and nursing note reviewed.  Constitutional:      Appearance: Normal appearance.  HENT:     Head: Normocephalic.  Neck:     Musculoskeletal: Normal range of motion and neck supple.     Comments: Cervical Paraspinal Tenderness: C-5-C-6 Cardiovascular:     Rate and Rhythm: Normal rate and regular rhythm.     Pulses: Normal pulses.     Heart sounds: Normal heart sounds.  Pulmonary:     Effort: Pulmonary effort is normal.     Breath sounds: Normal breath sounds.  Musculoskeletal:     Comments: Normal Muscle Bulk and Muscle Testing Reveals:  Upper Extremities: Full ROM and Muscle Strength 5/5 Bilateral AC Joint Tenderness  Thoracic Hypersensitivity: T-1-T-7 Lower Extremities: Full ROM and Muscle Strength 5/5  Arises from Table  with ease Narrow Based Gait   Skin:    General: Skin is warm and dry.  Neurological:  Mental Status: She is alert and oriented to person, place, and time.  Psychiatric:        Mood and Affect: Mood normal.        Behavior: Behavior normal.           Assessment & Plan:  1. Chronic Pain Syndrome:Continue current medication regimen: Refilled:Tapentadol 50 mg ER Q 12 hours #60andOxycodone 5/325 mg one tablet every 8 hours as needed #100.07/02/2018 2.Fibromyalgia:Continue HEP as Toleratedand continue current medication regimen with amitriptyline.07/02/2018 3. History of PTSD: Continue Counseling/ Awaiting Psychiatry appointment/ [facility] Pamphlet Given. 07/02/2018 4. History TBI: Continue to Monitor. 07/02/2018 5. Migraine with Aura: See HPI. Consider Botox or Aimovig: Dr. Naaman Plummer Decsion . 07/02/2018 6. Labile Blood Pressure: Blood Pressure Stable Today:Cardiology Following. 07/02/2018 7. ? POTS: Cardiology Following.07/02/2018 9. Lumbar Radiculitis:Continue current medication regimen with: Elavil..07/02/2018 10. Muscle Spasm: Continuecurrent medication regimen withFlexeril. 07/02/2018 11. Depression: Continue Cymbalta: PCP Following. Awaiting Psychiatric appointment. 07/02/2018. 12. Chronic Bilateral Thoracic Back Pain: Continue current medication regimen. Continue to monitor. 06/02/2018. 13. Cervicalgia/ Cervical Radiculitis: Continue HEP as Tolerated. Alternat Heat and Ice Therapy. Continue to Monitor.   45minutes of face to face patient care time was spent during this visit. All questions were encouraged and answered.  F/U in 1 month

## 2018-07-02 NOTE — Patient Instructions (Addendum)
    Take the ibuprofen 800 mg 3 times daily  Use the methocarbamol 1 or 2 pills (500 mg) every 6-8 hours as needed for upper back and neck pain.  Hold the Flexeril while on the methocarbamol.  Referral is being made to [facility], have asked for Dr. Brett Fairy if she is available.  There a re number of good neurologist in that group.  If acutely worse go to the emergency room. If you have lab work done today you will be contacted with your lab results within the next 2 weeks.  If you have not heard from [place] then please contact us. The fastest way to get your results is to register for My Chart.   IF you received an x-ray today, you will receive an invoice from [facility]. Please contact [facility] at [phone] with questions or concerns regarding your invoice.   IF you received labwork today, you will receive an invoice from [place]. Please contact [facility] at [phone] with questions or concerns regarding your invoice.   Our billing staff will not be able to assist you with questions regarding bills from these companies.  You will be contacted with the lab results as soon as they are available. The fastest way to get your results is to activate your My Chart account. Instructions are located on the last page of this paperwork. If you have not heard from [place] regarding the results in 2 weeks, please contact this office.

## 2018-07-03 NOTE — Telephone Encounter (Signed)
Placed a call to [name], regarding signing a release form, no answer. Left message to return the call.

## 2018-07-04 ENCOUNTER — Ambulatory Visit: Payer: BLUE CROSS/BLUE SHIELD | Admitting: Emergency Medicine

## 2018-07-04 ENCOUNTER — Encounter

## 2018-07-04 ENCOUNTER — Other Ambulatory Visit: Payer: Self-pay | Admitting: Neurology

## 2018-07-04 DIAGNOSIS — R569 Unspecified convulsions: Secondary | ICD-10-CM

## 2018-07-11 DIAGNOSIS — J45998 Other asthma: Secondary | ICD-10-CM | POA: Diagnosis not present

## 2018-07-11 DIAGNOSIS — B349 Viral infection, unspecified: Secondary | ICD-10-CM | POA: Diagnosis not present

## 2018-07-13 ENCOUNTER — Encounter: Payer: Self-pay | Admitting: Family Medicine

## 2018-07-13 ENCOUNTER — Telehealth: Payer: Self-pay | Admitting: Physical Medicine & Rehabilitation

## 2018-07-13 NOTE — Telephone Encounter (Signed)
Patients husband called back stating that they have signed the release papers and are hoping we can follow through as stated previously. See below.

## 2018-07-13 NOTE — Telephone Encounter (Signed)
Received med record req for 3 clinics in [place] - scanned all three to [facility].  Had to make 3 copies as they are too large to fax (15 pages) [place] 05/2017

## 2018-07-13 NOTE — Telephone Encounter (Signed)
Return Mr. Ouk call, he states Mrs. Hertzog has a scheduled appointment with new PCP on 07/21/18. This provider placed a call to [facility], they will not accept prescription from provider's in [place]. Placed a call to Mr. Sebree regarding the above he verbalizes understanding.

## 2018-07-16 ENCOUNTER — Ambulatory Visit (INDEPENDENT_AMBULATORY_CARE_PROVIDER_SITE_OTHER): Payer: BLUE CROSS/BLUE SHIELD

## 2018-07-16 ENCOUNTER — Telehealth: Payer: Self-pay

## 2018-07-16 ENCOUNTER — Other Ambulatory Visit: Payer: Self-pay

## 2018-07-16 ENCOUNTER — Encounter: Payer: Self-pay | Admitting: Family Medicine

## 2018-07-16 ENCOUNTER — Ambulatory Visit: Payer: BLUE CROSS/BLUE SHIELD | Admitting: Family Medicine

## 2018-07-16 VITALS — BP 146/106 | HR 90 | Temp 97.9°F | Resp 18 | Ht 61.0 in | Wt 215.0 lb

## 2018-07-16 DIAGNOSIS — J4531 Mild persistent asthma with (acute) exacerbation: Secondary | ICD-10-CM

## 2018-07-16 DIAGNOSIS — R05 Cough: Secondary | ICD-10-CM

## 2018-07-16 DIAGNOSIS — J22 Unspecified acute lower respiratory infection: Secondary | ICD-10-CM | POA: Diagnosis not present

## 2018-07-16 DIAGNOSIS — R059 Cough, unspecified: Secondary | ICD-10-CM

## 2018-07-16 LAB — POCT CBC
Granulocyte percent: 65.6 %G (ref 37–80)
HCT, POC: 44.2 % — AB (ref 29–41)
Hemoglobin: 15.3 g/dL — AB (ref 11–14.6)
Lymph, poc: 3.2 (ref 0.6–3.4)
MCH, POC: 30.4 pg (ref 27–31.2)
MCHC: 34.6 g/dL (ref 31.8–35.4)
MCV: 87.8 fL (ref 76–111)
MID (cbc): 0.3 (ref 0–0.9)
MPV: 7.3 fL (ref 0–99.8)
POC Granulocyte: 6.7 (ref 2–6.9)
POC LYMPH PERCENT: 31.6 %L (ref 10–50)
POC MID %: 2.8 %M (ref 0–12)
Platelet Count, POC: 310 10*3/uL (ref 142–424)
RBC: 5.03 M/uL (ref 4.04–5.48)
RDW, POC: 12.3 %
WBC: 10.2 10*3/uL (ref 4.6–10.2)

## 2018-07-16 MED ORDER — BENZONATATE 100 MG PO CAPS: 100.0000 mg | ORAL_CAPSULE | Freq: Three times a day (TID) | ORAL | 0 refills | Status: AC | PRN

## 2018-07-16 MED ORDER — PREDNISONE 20 MG PO TABS: 40.0000 mg | ORAL_TABLET | Freq: Every day | ORAL | 0 refills | Status: DC

## 2018-07-16 MED ORDER — METHYLPREDNISOLONE ACETATE 80 MG/ML IJ SUSP
80.0000 mg | Freq: Once | INTRAMUSCULAR | Status: AC
  Administered 2018-07-16: 80 mg via INTRAMUSCULAR

## 2018-07-16 MED ORDER — AMOXICILLIN-POT CLAVULANATE 875-125 MG PO TABS: 1.0000 | ORAL_TABLET | Freq: Two times a day (BID) | ORAL | 0 refills | Status: AC

## 2018-07-16 MED ORDER — ALBUTEROL SULFATE (2.5 MG/3ML) 0.083% IN NEBU
2.5000 mg | INHALATION_SOLUTION | Freq: Once | RESPIRATORY_TRACT | Status: AC
  Administered 2018-07-16: 2.5 mg via RESPIRATORY_TRACT

## 2018-07-16 NOTE — Patient Instructions (Signed)
° ° ° °  If you have lab work done today you will be contacted with your lab results within the next 2 weeks.  If you have not heard from us then please contact us. The fastest way to get your results is to register for My Chart. ° ° °IF you received an x-ray today, you will receive an invoice from [facility]. Please contact  Radiology at [phone] with questions or concerns regarding your invoice.  ° °IF you received labwork today, you will receive an invoice from [facility]. Please contact [facility] at [phone] with questions or concerns regarding your invoice.  ° °Our billing staff will not be able to assist you with questions regarding bills from these companies. ° °You will be contacted with the lab results as soon as they are available. The fastest way to get your results is to activate your My Chart account. Instructions are located on the last page of this paperwork. If you have not heard from us regarding the results in 2 weeks, please contact this office. °  ° ° ° °

## 2018-07-16 NOTE — Telephone Encounter (Signed)
Return Mr. Benison call, explain office policy regarding cough syrup with codeine. Ms. Joffe is at her PCP office at this time, he was instructed to call office if she is prescribed the cough syrup with codeine, he verbalizes understanding.

## 2018-07-16 NOTE — Telephone Encounter (Signed)
Patients husband called, wants to know if it would be a violation of the patients opioid contract if she is to receive a prescription for cough syrup if it has codeine in it.  States that patient has a virus and nasty cough and is wanting to take her to be seen but is trying not to violate the contract at this time.

## 2018-07-16 NOTE — Progress Notes (Signed)
1/2/202010:12 AM  Kristine Kelley [birth year], 28 y.o. female [id number]  Chief Complaint  Patient presents with  . Cough    x7days was tested for flu and strep on the 28th and both were negative     HPI:   Patient is a 28 y.o. female with past medical history significant for asthma, seizures, PTSD, POTS, migraines who presents today for cough  Got sick on 12/15, started as ear and throat pain, cough, fevers Getting worse, very productive cough with sweats, for past 2 days, making her vomit She also having flare up of her left neck pain, very stiff Seen at urgent care on 12/28 - tested neg flu and strep Sent home on prednisone 77m x 5 days  Using albuterol every few hours for tight chest, SOB Not using flovent as prescribed Usually well controlled juts on singulair Taking OTC cough suppressant  Fall Risk  07/16/2018 07/02/2018 07/02/2018 06/19/2018 06/02/2018  Falls in the past year? 0 0 0 0 0  Comment - - - - -  Number falls in past yr: - - - - -  Injury with Fall? - - - - -  Comment - - - - -  Risk Factor Category  - - - - -  Risk for fall due to : - - - - -  Follow up - - - - -     [facility] 07/16/2018 07/02/2018 06/19/2018  Decreased Interest 0 0 0  Down, Depressed, Hopeless 0 0 0  PHQ - 2 Score 0 0 0  Altered sleeping - - -  Tired, decreased energy - - -  Change in appetite - - -  Feeling bad or failure about yourself  - - -  Trouble concentrating - - -  Moving slowly or fidgety/restless - - -  Suicidal thoughts - - -  PHQ-9 Score - - -  Difficult doing work/chores - - -    Allergies  Allergen Reactions  . Ambien [Zolpidem Tartrate] Other (See Comments)    PTSD, nightmares   . Dilantin [Phenytoin]     Intravenously - it burns     Prior to Admission medications   Medication Sig Start Date End Date Taking? Authorizing Provider  albuterol (PROVENTIL HFA;VENTOLIN HFA) 108 (90 Base) MCG/ACT inhaler Inhale 2 puffs into the lungs every 4 (four)  hours as needed for wheezing or shortness of breath (cough, shortness of breath or wheezing.). 06/19/18  Yes SShawnee Knapp MD  amitriptyline (ELAVIL) 75 MG tablet Take 1 tablet (75 mg total) by mouth at bedtime. 04/09/18  Yes TBayard Hugger NP  APRI 0.15-30 MG-MCG tablet TAKE 1 TABLET BY MOUTH EVERY DAY 07/01/18  Yes Sagardia, MInes Bloomer MD  butalbital-acetaminophen-caffeine ([place] 5219-353-0096MG tablet As directed 05/07/17  Yes [provider]  cyclobenzaprine (FLEXERIL) 10 MG tablet Take 1 tablet (10 mg total) by mouth 3 (three) times daily as needed. 07/02/18  Yes TBayard Hugger NP  diclofenac (VOLTAREN) 50 MG EC tablet TAKE 1 TABLET BY MOUTH AT ONSET OF migraine. DO not TAKE more THAN 2 TO 3 TIMES PER WEEK 12/04/17  Yes ACameron Sprang MD  Diclofenac Potassium (CAMBIA) 50 MG PACK Use 1 packet at onset of migraine. Do not use more than 3 times a week 05/21/17  Yes ACameron Sprang MD  DULoxetine (CYMBALTA) 30 MG capsule Take 1 capsule (30 mg total) by mouth daily. 06/19/18  Yes SShawnee Knapp MD  fluticasone (FLONASE) 50 MCG/ACT  nasal spray Use 1 spray per nostril 1-2 times daily as needed 06/18/17  Yes Bobbitt, Sedalia Muta, MD  fluticasone (FLOVENT HFA) 110 MCG/ACT inhaler Inhale 2 puffs into the lungs 2 (two) times daily. During asthma flares. Use with spacer device. 06/19/18  Yes Shawnee Knapp, MD  hydrALAZINE (APRESOLINE) 10 MG tablet Take 10 mg by mouth as directed.   Yes [provider]  hydrALAZINE (APRESOLINE) 10 MG tablet TAKE 2 TABLETS BY MOUTH 3 TIMES DAILY AS NEEDED 01/07/18  Yes Lorretta Harp, MD  ibuprofen (ADVIL,MOTRIN) 800 MG tablet Take 800 mg by mouth 2 (two) times daily as needed.   Yes [provider]  lamoTRIgine (LAMICTAL) 100 MG tablet Take 1 tablet (100 mg total) daily by mouth. Take 1 tab in AM, 2 tabs in PM 05/21/17  Yes Cameron Sprang, MD  levocetirizine (XYZAL) 5 MG tablet Take 1 tablet (5 mg total) by mouth daily as needed for  allergies. 06/19/18  Yes Shawnee Knapp, MD  methocarbamol (ROBAXIN) 500 MG tablet Take one or two 3 times daily as needed for migraines muscle relaxant 07/02/18  Yes Posey Boyer, MD  metoprolol tartrate (LOPRESSOR) 50 MG tablet TAKE 1 TABLET BY MOUTH 2 TIMES DAILY. NEED OFFICE VISIT 03/09/18  Yes Lorretta Harp, MD  montelukast (SINGULAIR) 10 MG tablet Take 1 tablet (10 mg total) by mouth at bedtime. 06/19/18  Yes Shawnee Knapp, MD  [facility] 4 MG/0.1ML LIQD nasal spray kit As directed 06/10/17  Yes [provider]  Olopatadine HCl 0.2 % SOLN Use one drop each eye once daily. 09/10/17  Yes Bobbitt, Sedalia Muta, MD  ondansetron (ZOFRAN) 4 MG tablet Take 4 mg by mouth every 8 (eight) hours as needed for nausea or vomiting.   Yes [provider]  ondansetron (ZOFRAN-ODT) 4 MG disintegrating tablet Take 1 tablet by mouth every 8 (eight) hours as needed. 12/12/15  Yes [provider]  oxyCODONE-acetaminophen (PERCOCET/ROXICET) 5-325 MG tablet Take 1 tablet by mouth every 8 (eight) hours as needed for severe pain. may take an extra tablet when pain is severe 10 days out the month 07/02/18  Yes Bayard Hugger, NP  rizatriptan (MAXALT) 10 MG tablet As directed 06/04/17  Yes [provider]  rizatriptan (MAXALT) 10 MG tablet Take 1 tablet by mouth as needed for migraine. May repeat in 2 hours if needed. Max dose 90m/day 07/01/18  Yes Sagardia, MInes Bloomer MD  tapentadol (NUCYNTA) 50 MG 12 hr tablet Take 1 tablet (50 mg total) by mouth every 12 (twelve) hours. 07/02/18  Yes TBayard Hugger NP  traZODone (DESYREL) 100 MG tablet TAKE 1 TABLET BY MOUTH AT BEDTIME AS NEEDED FOR SLEEP 07/01/18  Yes SHorald Pollen MD    Past Medical History:  Diagnosis Date  . Asthma   . Benign tumor of [place] gland ([place]   . Chronic back pain   . Chronic pain   . Fibromyalgia   . Migraine   . PTSD (post-traumatic stress disorder)   . [place]izures [place]   . Tra[facility] injury  ([facility]     Past Surgical History:  Procedure Laterality Date  . APPENDECTOMY    . KIDNEY STONE SURGERY      Social History   Tobacco Use  . Smoking status: Never Smoker  . Smokeless tobacco: Never Used  Substance Use Topics  . Alcohol use: No    Family History  Problem Relation Age of Onset  . Heart disease  Mother   . Cancer Mother   . Drug abuse Mother   . Heart disease Father   . Drug abuse Father   . Asthma Brother   . Allergic rhinitis Neg Hx   . Eczema Neg Hx   . Urticaria Neg Hx   . Immunodeficiency Neg Hx   . Angioedema Neg Hx     ROS Per hpi  OBJECTIVE:  Blood pressure (!) 146/106, pulse 90, temperature 97.9 F (36.6 C), temperature source Oral, resp. rate 18, height '5\' 1"'  (1.549 m), weight 215 lb (97.5 kg), last menstrual period 06/30/2018, SpO2 96 %. Body mass index is 40.62 kg/m.   BP Readings from Last 3 Encounters:  07/16/18 (!) 146/106  07/02/18 (!) 141/87  07/02/18 (!) 137/93    Physical Exam Vitals signs and nursing note reviewed.  Constitutional:      Appearance: She is well-developed.  HENT:     Head: Normocephalic and atraumatic.     Right Ear: Hearing, tympanic membrane, ear canal and external ear normal.     Left Ear: Hearing, tympanic membrane, ear canal and external ear normal.  Eyes:     Conjunctiva/sclera: Conjunctivae normal.     Pupils: Pupils are equal, round, and reactive to light.  Neck:     Musculoskeletal: Neck supple.  Cardiovascular:     Rate and Rhythm: Normal rate and regular rhythm.     Heart sounds: Normal heart sounds. No murmur. No friction rub. No gallop.   Pulmonary:     Effort: Pulmonary effort is normal.     Breath sounds: Rales (RLL) present. No wheezing or rhonchi.     Comments: Deceased air movement Lymphadenopathy:     Cervical: No cervical adenopathy.  Skin:    General: Skin is warm.     Comments: clamy  Neurological:     Mental Status: She is alert and oriented to person, place, and time.     Peak flow reading is 250, about 64 % of predicted.  Results for orders placed or performed in visit on 07/16/18 (from the past 24 hour(s))  POCT CBC     Status: Abnormal   Collection Time: 07/16/18 10:41 AM  Result Value Ref Range   WBC 10.2 4.6 - 10.2 K/uL   Lymph, poc 3.2 0.6 - 3.4   POC LYMPH PERCENT 31.6 10 - 50 %L   MID (cbc) 0.3 0 - 0.9   POC MID % 2.8 0 - 12 %M   POC Granulocyte 6.7 2 - 6.9   Granulocyte percent 65.6 37 - 80 %G   RBC 5.03 4.04 - 5.48 M/uL   Hemoglobin 15.3 (A) 11 - 14.6 g/dL   HCT, POC 44.2 (A) 29 - 41 %   MCV 87.8 76 - 111 fL   MCH, POC 30.4 27 - 31.2 pg   MCHC 34.6 31.8 - 35.4 g/dL   RDW, POC 12.3 %   Platelet Count, POC 310 142 - 424 K/uL   MPV 7.3 0 - 99.8 fL    Dg Chest 2 View  Result Date: 07/16/2018 CLINICAL DATA:  Worsening productive cough EXAM: CHEST - 2 VIEW COMPARISON:  None. FINDINGS: The heart size and mediastinal contours are within normal limits. Both lungs are clear. The visualized skeletal structures are unremarkable. IMPRESSION: No active cardiopulmonary disease. Electronically Signed   By: Kathreen Devoid   On: 07/16/2018 10:35     ASSESSMENT and PLAN  1. Mild persistent asthma with acute exacerbation Discussed starting flovent. Extending steroid  treatment. RTC precautions reviewed.  - methylPREDNISolone acetate (DEPO-MEDROL) injection 80 mg  2. Lower respiratory tract infection Discussed supportive measures, new meds r/se/b and RTC precautions.   3. Cough - POCT CBC - DG Chest 2 View; Future - albuterol (PROVENTIL) (2.5 MG/3ML) 0.083% nebulizer solution 2.5 mg  Other orders - amoxicillin-clavulanate (AUGMENTIN) 875-125 MG tablet; Take 1 tablet by mouth 2 (two) times daily. - predniSONE (DELTASONE) 20 MG tablet; Take 2 tablets (40 mg total) by mouth daily with breakfast. - benzonatate (TESSALON) 100 MG capsule; Take 1-2 capsules (100-200 mg total) by mouth 3 (three) times daily as needed for cough.  Return in about 2 days  (around 07/18/2018).    Rutherford Guys, MD Primary Care at [place],  [address] Ph.  [phone] Fax [phone]

## 2018-07-18 ENCOUNTER — Other Ambulatory Visit: Payer: Self-pay

## 2018-07-18 ENCOUNTER — Ambulatory Visit: Payer: BLUE CROSS/BLUE SHIELD | Admitting: Family Medicine

## 2018-07-18 ENCOUNTER — Encounter: Payer: Self-pay | Admitting: Family Medicine

## 2018-07-18 VITALS — BP 135/85 | HR 104 | Temp 98.9°F | Resp 16 | Ht 62.0 in | Wt 211.8 lb

## 2018-07-18 DIAGNOSIS — J22 Unspecified acute lower respiratory infection: Secondary | ICD-10-CM

## 2018-07-18 DIAGNOSIS — Z5181 Encounter for therapeutic drug level monitoring: Secondary | ICD-10-CM | POA: Diagnosis not present

## 2018-07-18 DIAGNOSIS — R569 Unspecified convulsions: Secondary | ICD-10-CM | POA: Diagnosis not present

## 2018-07-18 DIAGNOSIS — J4531 Mild persistent asthma with (acute) exacerbation: Secondary | ICD-10-CM | POA: Diagnosis not present

## 2018-07-18 MED ORDER — ALBUTEROL SULFATE (2.5 MG/3ML) 0.083% IN NEBU
2.5000 mg | INHALATION_SOLUTION | Freq: Once | RESPIRATORY_TRACT | Status: AC
  Administered 2018-07-18: 2.5 mg via RESPIRATORY_TRACT

## 2018-07-18 MED ORDER — PREDNISONE 20 MG PO TABS: 40.0000 mg | ORAL_TABLET | Freq: Every day | ORAL | 0 refills | Status: AC

## 2018-07-18 MED ORDER — LAMOTRIGINE 100 MG PO TABS: 100.0000 mg | ORAL_TABLET | Freq: Every day | ORAL | 1 refills | Status: AC

## 2018-07-18 NOTE — Patient Instructions (Signed)
° ° ° °  If you have lab work done today you will be contacted with your lab results within the next 2 weeks.  If you have not heard from us then please contact us. The fastest way to get your results is to register for My Chart. ° ° °IF you received an x-ray today, you will receive an invoice from [facility]. Please contact [facility] at [phone] with questions or concerns regarding your invoice.  ° °IF you received labwork today, you will receive an invoice from [facility]. Please contact [facility] at [phone] with questions or concerns regarding your invoice.  ° °Our billing staff will not be able to assist you with questions regarding bills from these companies. ° °You will be contacted with the lab results as soon as they are available. The fastest way to get your results is to activate your My Chart account. Instructions are located on the last page of this paperwork. If you have not heard from us regarding the results in 2 weeks, please contact this office. °  ° ° ° °

## 2018-07-18 NOTE — Progress Notes (Signed)
1/4/202010:06 AM  Kristine Kelley Aug 16, 1990, 28 y.o. female [id number]  Chief Complaint  Patient presents with  . Pneumonia    follow up  . Medication Refill    Lamictal    HPI:   Patient is a 28 y.o. female with past medical history significant for asthma, seizures, migraines who presents today for followup for asthma, CAP  Last OV 2 days ago, pred and augmentin rx Feeling about 25% better Burning with breathing, feeling tight Using albuterol 2 puffs 3-5 x day Started flovent inhaler  Tolerating augmenting well, cough being controlled with tessalon pearls, mildly less productive Less chills and night sweats  Last  Neuro OV nov 2018, Dr Delice Lesch, next appt with her on 8 months from now, has had no seizures on lamictal, requesting refill   Fall Risk  07/18/2018 07/16/2018 07/02/2018 07/02/2018 06/19/2018  Falls in the past year? 0 0 0 0 0  Comment - - - - -  Number falls in past yr: - - - - -  Injury with Fall? - - - - -  Comment - - - - -  Risk Factor Category  - - - - -  Risk for fall due to : - - - - -  Follow up - - - - -     Depression screen [facility] 2/9 07/18/2018 07/16/2018 07/02/2018  Decreased Interest 0 0 0  Down, Depressed, Hopeless 0 0 0  PHQ - 2 Score 0 0 0  Altered sleeping - - -  Tired, decreased energy - - -  Change in appetite - - -  Feeling bad or failure about yourself  - - -  Trouble concentrating - - -  Moving slowly or fidgety/restless - - -  Suicidal thoughts - - -  PHQ-9 Score - - -  Difficult doing work/chores - - -    Allergies  Allergen Reactions  . Ambien [Zolpidem Tartrate] Other (See Comments)    PTSD, nightmares   . Dilantin [Phenytoin]     Intravenously - it burns     Prior to Admission medications   Medication Sig Start Date End Date Taking? Authorizing Provider  albuterol (PROVENTIL HFA;VENTOLIN HFA) 108 (90 Base) MCG/ACT inhaler Inhale 2 puffs into the lungs every 4 (four) hours as needed for wheezing or shortness of breath (cough,  shortness of breath or wheezing.). 06/19/18  Yes Shawnee Knapp, MD  amitriptyline (ELAVIL) 75 MG tablet Take 1 tablet (75 mg total) by mouth at bedtime. 04/09/18  Yes Bayard Hugger, NP  amoxicillin-clavulanate (AUGMENTIN) 875-125 MG tablet Take 1 tablet by mouth 2 (two) times daily. 07/16/18  Yes Rutherford Guys, MD  APRI 0.15-30 MG-MCG tablet TAKE 1 TABLET BY MOUTH EVERY DAY 07/01/18  Yes Sagardia, Ines Bloomer, MD  benzonatate (TESSALON) 100 MG capsule Take 1-2 capsules (100-200 mg total) by mouth 3 (three) times daily as needed for cough. 07/16/18  Yes Rutherford Guys, MD  butalbital-acetaminophen-caffeine [facility]) [id number] MG tablet As directed 05/07/17  Yes [provider]  cyclobenzaprine (FLEXERIL) 10 MG tablet Take 1 tablet (10 mg total) by mouth 3 (three) times daily as needed. 07/02/18  Yes Bayard Hugger, NP  diclofenac (VOLTAREN) 50 MG EC tablet TAKE 1 TABLET BY MOUTH AT ONSET OF migraine. DO not TAKE more THAN 2 TO 3 TIMES PER WEEK 12/04/17  Yes Cameron Sprang, MD  Diclofenac Potassium (CAMBIA) 50 MG PACK Use 1 packet at onset of migraine. Do not use more than 3  times a week 05/21/17  Yes Cameron Sprang, MD  DULoxetine (CYMBALTA) 30 MG capsule Take 1 capsule (30 mg total) by mouth daily. 06/19/18  Yes Shawnee Knapp, MD  fluticasone [facility]) 50 MCG/ACT nasal spray Use 1 spray per nostril 1-2 times daily as needed 06/18/17  Yes Bobbitt, Sedalia Muta, MD  fluticasone (FLOVENT HFA) 110 MCG/ACT inhaler Inhale 2 puffs into the lungs 2 (two) times daily. During asthma flares. Use with spacer device. 06/19/18  Yes Shawnee Knapp, MD  hydrALAZINE (APRESOLINE) 10 MG tablet Take 10 mg by mouth as directed.   Yes [provider]  hydrALAZINE (APRESOLINE) 10 MG tablet TAKE 2 TABLETS BY MOUTH 3 TIMES DAILY AS NEEDED 01/07/18  Yes Lorretta Harp, MD  ibuprofen (ADVIL,MOTRIN) 800 MG tablet Take 800 mg by mouth 2 (two) times daily as needed.   Yes [provider]  lamoTRIgine  (LAMICTAL) 100 MG tablet Take 1 tablet (100 mg total) daily by mouth. Take 1 tab in AM, 2 tabs in PM 05/21/17  Yes Cameron Sprang, MD  levocetirizine (XYZAL) 5 MG tablet Take 1 tablet (5 mg total) by mouth daily as needed for allergies. 06/19/18  Yes Shawnee Knapp, MD  methocarbamol (ROBAXIN) 500 MG tablet Take one or two 3 times daily as needed for migraines muscle relaxant 07/02/18  Yes Posey Boyer, MD  metoprolol tartrate (LOPRESSOR) 50 MG tablet TAKE 1 TABLET BY MOUTH 2 TIMES DAILY. NEED OFFICE VISIT 03/09/18  Yes Lorretta Harp, MD  montelukast (SINGULAIR) 10 MG tablet Take 1 tablet (10 mg total) by mouth at bedtime. 06/19/18  Yes Shawnee Knapp, MD  [facility] 4 MG/0.1ML LIQD nasal spray kit As directed 06/10/17  Yes [provider]  Olopatadine HCl 0.2 % SOLN Use one drop each eye once daily. 09/10/17  Yes Bobbitt, Sedalia Muta, MD  ondansetron (ZOFRAN-ODT) 4 MG disintegrating tablet Take 1 tablet by mouth every 8 (eight) hours as needed. 12/12/15  Yes [provider]  oxyCODONE-acetaminophen (PERCOCET/ROXICET) 5-325 MG tablet Take 1 tablet by mouth every 8 (eight) hours as needed for severe pain. may take an extra tablet when pain is severe 10 days out the month 07/02/18  Yes Bayard Hugger, NP  rizatriptan (MAXALT) 10 MG tablet As directed 06/04/17  Yes [provider]  rizatriptan (MAXALT) 10 MG tablet Take 1 tablet by mouth as needed for migraine. May repeat in 2 hours if needed. Max dose 66m/day 07/01/18  Yes Sagardia, MInes Bloomer MD  tapentadol (NUCYNTA) 50 MG 12 hr tablet Take 1 tablet (50 mg total) by mouth every 12 (twelve) hours. 07/02/18  Yes TBayard Hugger NP  traZODone (DESYREL) 100 MG tablet TAKE 1 TABLET BY MOUTH AT BEDTIME AS NEEDED FOR SLEEP 07/01/18  Yes Sagardia, MInes Bloomer MD  ondansetron (ZOFRAN) 4 MG tablet Take 4 mg by mouth every 8 (eight) hours as needed for nausea or vomiting.    [provider]  predniSONE (DELTASONE) 20 MG tablet  Take 2 tablets (40 mg total) by mouth daily with breakfast. Patient not taking: Reported on 07/18/2018 07/16/18   SRutherford Guys MD    Past Medical History:  Diagnosis Date  . Asthma   . Benign tumor of pituitary gland ([place]   . Chronic back pain   . Chronic pain   . Fibromyalgia   . Migraine   . PTSD (post-traumatic stress disorder)   . [place]   . Traumatic brain injury ([facility]  Past Surgical History:  Procedure Laterality Date  . APPENDECTOMY    . KIDNEY STONE SURGERY      Social History   Tobacco Use  . Smoking status: Never Smoker  . Smokeless tobacco: Never Used  Substance Use Topics  . Alcohol use: No    Family History  Problem Relation Age of Onset  . Heart disease Mother   . Cancer Mother   . Drug abuse Mother   . Heart disease Father   . Drug abuse Father   . Asthma Brother   . Allergic rhinitis Neg Hx   . Eczema Neg Hx   . Urticaria Neg Hx   . Immunodeficiency Neg Hx   . Angioedema Neg Hx     ROS Per hpi  OBJECTIVE:  Blood pressure 135/85, pulse (!) 104, temperature 98.9 F (37.2 C), temperature source Oral, resp. rate 16, height '5\' 2"'  (1.575 m), weight 211 lb 12.8 oz (96.1 kg), last menstrual period 06/30/2018, SpO2 94 %. Body mass index is 38.74 kg/m.   Physical Exam Vitals signs and nursing note reviewed.  Constitutional:      Appearance: She is well-developed.     Comments: Not as ill appearing and clamy as 2 days ago  HENT:     Head: Normocephalic and atraumatic.     Mouth/Throat:     Pharynx: No oropharyngeal exudate.  Eyes:     General: No scleral icterus.    Conjunctiva/sclera: Conjunctivae normal.     Pupils: Pupils are equal, round, and reactive to light.  Neck:     Musculoskeletal: Neck supple.  Cardiovascular:     Rate and Rhythm: Normal rate and regular rhythm.     Heart sounds: Normal heart sounds. No murmur. No friction rub. No gallop.   Pulmonary:     Effort: Pulmonary effort is normal.     Breath sounds:  Decreased air movement present. No wheezing, rhonchi or rales.     Comments: Still overall tight, improved after albuterol neb Skin:    General: Skin is warm and dry.  Neurological:     Mental Status: She is alert and oriented to person, place, and time.     Peak flow reading is 350, about 87 % of predicted. - pre, improved Peak flow reading is 380 - post  ASSESSMENT and PLAN  1. Mild persistent asthma with acute exacerbation 2. Lower respiratory tract infection Improving. Cont current treatment. ER precautions given - albuterol (PROVENTIL) (2.5 MG/3ML) 0.083% nebulizer solution 2.5 mg  3. Medication monitoring encounter - Lamotrigine level - Comprehensive metabolic panel  4. Convulsions, unspecified convulsion type ([place]) Lamictal refilled. Keep appt with neuro. Checking labs today - lamoTRIgine (LAMICTAL) 100 MG tablet; Take 1 tablet (100 mg total) by mouth daily. Take 1 tab in AM, 2 tabs in PM  Other orders - Check Peak Flow - predniSONE (DELTASONE) 20 MG tablet; Take 2 tablets (40 mg total) by mouth daily with breakfast.  Return in about 1 week (around 07/25/2018).    Rutherford Guys, MD Primary Care at [place], [place] [address] Ph.  [phone] Fax [phone]

## 2018-07-21 DIAGNOSIS — R030 Elevated blood-pressure reading, without diagnosis of hypertension: Secondary | ICD-10-CM | POA: Diagnosis not present

## 2018-07-21 DIAGNOSIS — E669 Obesity, unspecified: Secondary | ICD-10-CM | POA: Diagnosis not present

## 2018-07-21 DIAGNOSIS — Z0000 Encounter for general adult medical examination without abnormal findings: Secondary | ICD-10-CM | POA: Diagnosis not present

## 2018-07-22 LAB — COMPREHENSIVE METABOLIC PANEL
ALT: 34 IU/L — ABNORMAL HIGH (ref 0–32)
AST: 20 IU/L (ref 0–40)
Albumin/Globulin Ratio: 1.5 (ref 1.2–2.2)
Albumin: 4.8 g/dL (ref 3.5–5.5)
Alkaline Phosphatase: 66 IU/L (ref 39–117)
BUN/Creatinine Ratio: 30 — ABNORMAL HIGH (ref 9–23)
BUN: 24 mg/dL — ABNORMAL HIGH (ref 6–20)
Bilirubin Total: <0.2 mg/dL (ref 0.0–1.2)
CO2: 16 mmol/L — ABNORMAL LOW (ref 20–29)
Calcium: 10.2 mg/dL (ref 8.7–10.2)
Chloride: 103 mmol/L (ref 96–106)
Creatinine, Ser: 0.79 mg/dL (ref 0.57–1.00)
GFR calc Af Amer: 117 mL/min/{1.73_m2} (ref >59)
GFR calc non Af Amer: 101 mL/min/{1.73_m2} (ref >59)
Globulin, Total: 3.1 g/dL (ref 1.5–4.5)
Glucose: 86 mg/dL (ref 65–99)
Potassium: 4 mmol/L (ref 3.5–5.2)
Sodium: 138 mmol/L (ref 134–144)
Total Protein: 7.9 g/dL (ref 6.0–8.5)

## 2018-07-22 LAB — LAMOTRIGINE LEVEL: Lamotrigine Lvl: NOT DETECTED ug/mL (ref 2.0–20.0)

## 2018-07-23 ENCOUNTER — Ambulatory Visit: Payer: BLUE CROSS/BLUE SHIELD | Admitting: Emergency Medicine

## 2018-07-23 ENCOUNTER — Encounter

## 2018-07-24 ENCOUNTER — Ambulatory Visit: Payer: BLUE CROSS/BLUE SHIELD | Admitting: Family Medicine

## 2018-07-28 ENCOUNTER — Other Ambulatory Visit: Payer: Self-pay

## 2018-07-28 ENCOUNTER — Encounter
Payer: BLUE CROSS/BLUE SHIELD | Attending: Physical Medicine & Rehabilitation | Admitting: Physical Medicine & Rehabilitation

## 2018-07-28 ENCOUNTER — Encounter: Payer: Self-pay | Admitting: Physical Medicine & Rehabilitation

## 2018-07-28 VITALS — BP 137/96 | HR 105 | Ht 62.0 in | Wt 217.0 lb

## 2018-07-28 DIAGNOSIS — G894 Chronic pain syndrome: Secondary | ICD-10-CM | POA: Diagnosis not present

## 2018-07-28 DIAGNOSIS — E669 Obesity, unspecified: Secondary | ICD-10-CM | POA: Diagnosis not present

## 2018-07-28 DIAGNOSIS — F4310 Post-traumatic stress disorder, unspecified: Secondary | ICD-10-CM | POA: Diagnosis not present

## 2018-07-28 DIAGNOSIS — R0989 Other specified symptoms and signs involving the circulatory and respiratory systems: Secondary | ICD-10-CM | POA: Insufficient documentation

## 2018-07-28 DIAGNOSIS — Z813 Family history of other psychoactive substance abuse and dependence: Secondary | ICD-10-CM | POA: Insufficient documentation

## 2018-07-28 DIAGNOSIS — D497 Neoplasm of unspecified behavior of endocrine glands and other parts of nervous system: Secondary | ICD-10-CM | POA: Diagnosis not present

## 2018-07-28 DIAGNOSIS — J45909 Unspecified asthma, uncomplicated: Secondary | ICD-10-CM | POA: Insufficient documentation

## 2018-07-28 DIAGNOSIS — M797 Fibromyalgia: Secondary | ICD-10-CM

## 2018-07-28 DIAGNOSIS — M5416 Radiculopathy, lumbar region: Secondary | ICD-10-CM

## 2018-07-28 DIAGNOSIS — G43909 Migraine, unspecified, not intractable, without status migrainosus: Secondary | ICD-10-CM | POA: Diagnosis not present

## 2018-07-28 DIAGNOSIS — Z79899 Other long term (current) drug therapy: Secondary | ICD-10-CM | POA: Insufficient documentation

## 2018-07-28 DIAGNOSIS — Z8249 Family history of ischemic heart disease and other diseases of the circulatory system: Secondary | ICD-10-CM | POA: Diagnosis not present

## 2018-07-28 DIAGNOSIS — G8929 Other chronic pain: Secondary | ICD-10-CM | POA: Diagnosis present

## 2018-07-28 DIAGNOSIS — M549 Dorsalgia, unspecified: Secondary | ICD-10-CM | POA: Insufficient documentation

## 2018-07-28 DIAGNOSIS — Z87820 Personal history of traumatic brain injury: Secondary | ICD-10-CM | POA: Diagnosis not present

## 2018-07-28 DIAGNOSIS — G40909 Epilepsy, unspecified, not intractable, without status epilepticus: Secondary | ICD-10-CM | POA: Diagnosis not present

## 2018-07-28 DIAGNOSIS — G43109 Migraine with aura, not intractable, without status migrainosus: Secondary | ICD-10-CM

## 2018-07-28 MED ORDER — TAPENTADOL HCL ER 50 MG PO TB12: 50.0000 mg | ORAL_TABLET | Freq: Two times a day (BID) | ORAL | 0 refills | Status: DC

## 2018-07-28 MED ORDER — OXYCODONE-ACETAMINOPHEN 5-325 MG PO TABS: 1.0000 | ORAL_TABLET | Freq: Three times a day (TID) | ORAL | 0 refills | Status: DC | PRN

## 2018-07-28 MED ORDER — ERENUMAB-AOOE 70 MG/ML ~~LOC~~ SOAJ: 70.0000 mg | SUBCUTANEOUS | 3 refills | Status: AC

## 2018-07-28 NOTE — Progress Notes (Signed)
Subjective:    Patient ID: Kristine Kelley, female    DOB: 04/30/1991, 28 y.o.   MRN: [id number]  HPI   Kristine Kelley is here in follow-up of her chronic back pain. She has been dealing with a pneumonia and has an ongoing cough.  The repeated coughing has caused difficulties with increased cervicalgia and shoulder pain.  Her pain levels have been under good control with Nucynta and Percocet.  She also tries to stay as active as she can.  She is moving to [place] over the next 2 weeks and is seeking follow-up for fibromyalgia and pain there.  She has met a primary care physician initially.  Another issue which has been ongoing is chronic migraines.  They are typically without aura but she is reporting about 14 to 15 days of headaches per month.  Each migraine may last several days.  Pain Inventory Average Pain 6 Pain Right Now 6 My pain is sharp, burning, dull and aching  In the last 24 hours, has pain interfered with the following? General activity 4 Relation with others 4 Enjoyment of life 4 What TIME of day is your pain at its worst? morning and night Sleep (in general) Good  Pain is worse with: bending, sitting, inactivity and standing Pain improves with: rest, heat/ice, therapy/exercise and medication Relief from Meds: 8  Mobility walk without assistance ability to climb steps?  yes do you drive?  yes  Function employed # of hrs/week 20 I need assistance with the following:  meal prep, household duties and shopping  Neuro/Psych No problems in this area  Prior Studies Any changes since last visit?  no  Physicians involved in your care Any changes since last visit?  no   Family History  Problem Relation Age of Onset  . Heart disease Mother   . Cancer Mother   . Drug abuse Mother   . Heart disease Father   . Drug abuse Father   . Asthma Brother   . Allergic rhinitis Neg Hx   . Eczema Neg Hx   . Urticaria Neg Hx   . Immunodeficiency Neg Hx   . Angioedema Neg Hx     Social History   Socioeconomic History  . Marital status: Married    Spouse name: Not on file  . Number of children: 0  . Years of education: Not on file  . Highest education level: Not on file  Occupational History  . Not on file  Social Needs  . Financial resource strain: Not on file  . Food insecurity:    Worry: Not on file    Inability: Not on file  . Transportation needs:    Medical: Not on file    Non-medical: Not on file  Tobacco Use  . Smoking status: Never Smoker  . Smokeless tobacco: Never Used  Substance and Sexual Activity  . Alcohol use: No  . Drug use: No  . Sexual activity: Yes    Birth control/protection: Pill  Lifestyle  . Physical activity:    Days per week: Not on file    Minutes per session: Not on file  . Stress: Not on file  Relationships  . Social connections:    Talks on phone: Not on file    Gets together: Not on file    Attends religious service: Not on file    Active member of club or organization: Not on file    Attends meetings of clubs or organizations: Not on file    Relationship  status: Not on file  Other Topics Concern  . Not on file  Social History Narrative   Pt lives in 1 story home with her husband   Has 1 step child who does not reside with pt   Some college   Nurse, children's.    Past Surgical History:  Procedure Laterality Date  . APPENDECTOMY    . KIDNEY STONE SURGERY     Past Medical History:  Diagnosis Date  . Asthma   . Benign tumor of pituitary gland ([place])   . Chronic back pain   . Chronic pain   . Fibromyalgia   . Migraine   . PTSD (post-traumatic stress disorder)   . Seizures ([place])   . Traumatic brain injury ([place])    LMP 06/30/2018   Opioid Risk Score:   Fall Risk Score:  `1  Depression screen PHQ 2/9  Depression screen [facility] 2/9 07/18/2018 07/16/2018 07/02/2018 06/19/2018 06/02/2018 04/14/2018 01/14/2018  Decreased Interest 0 0 0 0 0 0 0  Down, Depressed, Hopeless 0 0 0 0 0 0 0   PHQ - 2 Score 0 0 0 0 0 0 0  Altered sleeping - - - - - - -  Tired, decreased energy - - - - - - -  Change in appetite - - - - - - -  Feeling bad or failure about yourself  - - - - - - -  Trouble concentrating - - - - - - -  Moving slowly or fidgety/restless - - - - - - -  Suicidal thoughts - - - - - - -  PHQ-9 Score - - - - - - -  Difficult doing work/chores - - - - - - -    Review of Systems  Constitutional: Positive for diaphoresis.  HENT: Negative.   Eyes: Negative.   Respiratory: Positive for cough, shortness of breath and wheezing.   Cardiovascular: Negative.   Gastrointestinal: Negative.   Endocrine: Negative.   Genitourinary: Negative.   Musculoskeletal: Positive for back pain and neck pain.  Skin: Negative.   Allergic/Immunologic: Negative.   Neurological: Negative.  Negative for dizziness.  Hematological: Negative.   All other systems reviewed and are negative.      Objective:   Physical Exam General: No acute distress HEENT: EOMI, oral membranes moist Cards: reg rate  Chest: normal effort Abdomen: Soft, NT, ND Skin: dry, intact Extremities: no edema    Skin:Clean and intact without signs of breakdown Neuro:cognitively she seems generally appropriate. No focal CN signs or motor/sensory deficits. Musculoskeletal:  Cervical range of motion is a bit impaired today as she is had some pain from coughing.  Lumbar range of motion is fair to borderline. Psych:Bright and pleasant        1.Chronic pain syndrome most consistent with fibromyalgia 2. History of PTSD 3. History of multiple brain traumas/concussions 4. Chronic migraines--having at least 14-15 headache days pe rmonth.  5. Seizure disorder 6. Chronic back pain 7. Pituitary tumor 8. Labile blood pressure and heart rate suggestive of autonomic dysfunction/POTS 9. Recent weight gain/obesity  Plan: 1.Continue with both exercise and vocational efforts as we have  discussed. 2. For sleep and for pain, continue elavil 48m. She may continue with trazodone at the current 100 mg nightly dose.  3.  Recommended that she reduce her Cymbalta to 30 mg daily.  This may help with her tachycardia and had hypertension associated with activity. 4. Seizure headache management per neurology.  5. POTS  follow-up as recommended by cardiology 6.Refilled nucynta ER 86m and percocet today, #60, #100 respectively. -We will continue the controlled substance monitoring program, this consists of regular clinic visits, examinations, routine drug screening, pill [place] well [place].   -I would be willing to write another rx for these for next month since she's moving but further rx would require visit to office.  7.  Trial of Aimovig 70 mg monthly for migraines.  She was given refills and medication was described and discussed today in the office.   15 minutes of face to face patient care time were spent during this visit. All questions were encouraged and answered. We will go ahead and schedule her for a 272-monthollow-up with our nurse practitioner.

## 2018-07-28 NOTE — Patient Instructions (Signed)
PLEASE FEEL FREE TO CALL OUR OFFICE WITH ANY PROBLEMS OR QUESTIONS ([phone])      

## 2018-07-29 ENCOUNTER — Ambulatory Visit: Payer: BLUE CROSS/BLUE SHIELD | Admitting: Physical Medicine & Rehabilitation

## 2018-07-29 DIAGNOSIS — Z3202 Encounter for pregnancy test, result negative: Secondary | ICD-10-CM | POA: Diagnosis not present

## 2018-07-29 DIAGNOSIS — N201 Calculus of ureter: Secondary | ICD-10-CM | POA: Diagnosis not present

## 2018-07-31 ENCOUNTER — Telehealth: Payer: Self-pay

## 2018-07-31 ENCOUNTER — Other Ambulatory Visit: Payer: Self-pay | Admitting: Family Medicine

## 2018-07-31 DIAGNOSIS — R569 Unspecified convulsions: Secondary | ICD-10-CM

## 2018-07-31 DIAGNOSIS — N200 Calculus of kidney: Secondary | ICD-10-CM | POA: Diagnosis not present

## 2018-07-31 NOTE — Telephone Encounter (Signed)
What more are they giving beyond nucynta and oxycodone?

## 2018-07-31 NOTE — Telephone Encounter (Signed)
Pt and husband of pt called stating she went to the ER and diagnosed with kidney stones and given pain medication in the ER and also given medicine until seeing urologist. She didn't get filled though but she is at the urologist now and they want to give her more pain medication. Is this okay?

## 2018-07-31 NOTE — Telephone Encounter (Signed)
She can have whatever they prescribe. Just have her let us know what it is. Unclear why she would need more medication beyond what she's already on though.

## 2018-07-31 NOTE — Telephone Encounter (Signed)
ER prescribed #12 Percocet total which didn't fill. Still at Urologist and not sure what pain medication they prescribe, she has been prescribed Flomax so far.

## 2018-07-31 NOTE — Telephone Encounter (Signed)
Requested medication (s) are due for refill today: yes  Requested medication (s) are on the active medication list: yes  Last refill:  07/18/18  Future visit scheduled: yws  Notes to clinic:     Requested Prescriptions  Pending Prescriptions Disp Refills   lamoTRIgine (LAMICTAL) 100 MG tablet [Pharmacy Med Name: LAMOTRIGINE 100 MG TABLET] 90 tablet 1    Sig: TAKE 1 TABLET BY MOUTH IN THE MORNING AND TAKE 2 TABLETS BY MOUTH EVERY IN THE EVENING     Not Delegated - Neurology:  Anticonvulsants Failed - 07/31/2018 11:33 AM      Failed - This refill cannot be delegated      Failed - HCT in normal range and within 360 days    HCT  Date Value Ref Range Status  01/20/2017 39.7 36.0 - 46.0 % Final   HCT, POC  Date Value Ref Range Status  07/16/2018 44.2 (A) 29 - 41 % Final   Hematocrit  Date Value Ref Range Status  10/02/2016 40.4 34.0 - 46.6 % Final         Failed - HGB in normal range and within 360 days    Hemoglobin  Date Value Ref Range Status  07/16/2018 15.3 (A) 11 - 14.6 g/dL Final  01/20/2017 13.8 12.0 - 15.0 g/dL Final  10/02/2016 13.7 11.1 - 15.9 g/dL Final         Passed - PLT in normal range and within 360 days    Platelets  Date Value Ref Range Status  01/20/2017 239 150 - 400 K/uL Final  10/02/2016 251 150 - 379 x10E3/uL Final   Platelet Count, POC  Date Value Ref Range Status  07/16/2018 310 142 - 424 K/uL Final         Passed - WBC in normal range and within 360 days    WBC  Date Value Ref Range Status  07/16/2018 10.2 4.6 - 10.2 K/uL Final  01/20/2017 6.0 4.0 - 10.5 K/uL Final         Passed - Valid encounter within last 12 months    Recent Outpatient Visits          1 week ago Mild persistent asthma with acute exacerbation   [facility], Lilia Argue, MD   2 weeks ago Mild persistent asthma with acute exacerbation   [facility], Lilia Argue, MD   4 weeks ago Intractable migraine with aura with status migrainosus    [facility] Care at [facility], Fenton Malling, MD   1 month ago Mild persistent asthma, unspecified whether complicated   [facility], Laurey Arrow, MD   10 months ago Lymphadenopathy   Primary Care at [facility], [place], [place]

## 2018-07-31 NOTE — Telephone Encounter (Signed)
Spoke with patient.  They gave her a script for 12 tabs of the same oxycodone that we prescribe.  They wanted to increase her pain coverage to every 4 hours.

## 2018-08-03 ENCOUNTER — Ambulatory Visit: Payer: BLUE CROSS/BLUE SHIELD | Admitting: Physical Medicine & Rehabilitation

## 2018-08-04 ENCOUNTER — Telehealth: Payer: Self-pay | Admitting: *Deleted

## 2018-08-04 DIAGNOSIS — Z87820 Personal history of traumatic brain injury: Secondary | ICD-10-CM

## 2018-08-04 DIAGNOSIS — M5416 Radiculopathy, lumbar region: Secondary | ICD-10-CM

## 2018-08-04 DIAGNOSIS — M797 Fibromyalgia: Secondary | ICD-10-CM

## 2018-08-04 DIAGNOSIS — G894 Chronic pain syndrome: Secondary | ICD-10-CM

## 2018-08-04 DIAGNOSIS — F4310 Post-traumatic stress disorder, unspecified: Secondary | ICD-10-CM

## 2018-08-04 MED ORDER — OXYCODONE-ACETAMINOPHEN 5-325 MG PO TABS: 1.0000 | ORAL_TABLET | Freq: Three times a day (TID) | ORAL | 0 refills | Status: AC | PRN

## 2018-08-04 NOTE — Telephone Encounter (Signed)
Pharmacist called and they are unable to get the generic percocet for Karey at this time.  They say Megan requests the new rx go to [facility] @[email or web address]

## 2018-08-04 NOTE — Telephone Encounter (Signed)
done

## 2018-08-04 NOTE — Telephone Encounter (Signed)
Aniayah's husband notified.

## 2018-08-13 ENCOUNTER — Telehealth: Payer: Self-pay | Admitting: *Deleted

## 2018-08-13 NOTE — Telephone Encounter (Signed)
Patient's husband left a message asking to speak to Boyd about their recent move to [place], [place].  They are wanting to know if we can provide a script across state lines for patients pain medication to [facility].  They said they also need a prior authorization completed for nucynta as patient is starting a new insurance, [employer].

## 2018-08-14 ENCOUNTER — Telehealth: Payer: Self-pay | Admitting: Registered Nurse

## 2018-08-14 NOTE — Telephone Encounter (Signed)
Return Kristine Kelley call, at this time he doesn't have the new insurance card. Dr. Naaman Plummer note was reviewed, he's willing to prescribe medication  due to their move. After this they will have to be seen in our office, he verbalizes understanding. Once they have her insurance card he was instructed to call office, he verbalizes understanding.

## 2018-08-14 NOTE — Telephone Encounter (Signed)
Legrand Como,  faxed over new insurance information, he asked if we could send Nucynta prescription to [facility] in [place], [place]. Placed a call to [facility] and spoke to [place] the pharmacist, they will not be able to accept our prescription since our office is out of state. Call placed to [facility] regarding the above, he verbalizes understanding.

## 2018-08-15 ENCOUNTER — Encounter

## 2018-08-17 ENCOUNTER — Telehealth: Payer: Self-pay

## 2018-08-17 DIAGNOSIS — G894 Chronic pain syndrome: Secondary | ICD-10-CM

## 2018-08-17 DIAGNOSIS — M5416 Radiculopathy, lumbar region: Secondary | ICD-10-CM

## 2018-08-17 DIAGNOSIS — M797 Fibromyalgia: Secondary | ICD-10-CM

## 2018-08-17 DIAGNOSIS — Z87820 Personal history of traumatic brain injury: Secondary | ICD-10-CM

## 2018-08-17 DIAGNOSIS — F4310 Post-traumatic stress disorder, unspecified: Secondary | ICD-10-CM

## 2018-08-17 MED ORDER — TAPENTADOL HCL ER 50 MG PO TB12: 50.0000 mg | ORAL_TABLET | Freq: Two times a day (BID) | ORAL | 0 refills | Status: AC

## 2018-08-17 NOTE — Telephone Encounter (Signed)
Pt husband called to request refill for pt.on Nucynta last filled 07-16-2018 Hadn't got into see a doctor yet in know location. Send into [facility]

## 2018-08-17 NOTE — Telephone Encounter (Signed)
done

## 2018-08-25 ENCOUNTER — Other Ambulatory Visit: Payer: Self-pay | Admitting: Family Medicine

## 2018-08-25 DIAGNOSIS — R569 Unspecified convulsions: Secondary | ICD-10-CM

## 2018-08-25 NOTE — Telephone Encounter (Signed)
Requested medication (s) are due for refill today: yes  Requested medication (s) are on the active medication list: yes    Last refill: 07/18/2018  #90  1 refill  Future visit scheduled no  Notes to clinic:not delegated  Requested Prescriptions  Pending Prescriptions Disp Refills   lamoTRIgine (LAMICTAL) 100 MG tablet [Pharmacy Med Name: LAMOTRIGINE 100 MG TABLET] 90 tablet 1    Sig: TAKE 1 TABLET BY MOUTH IN THE MORNING AND TAKE 2 TABLETS BY MOUTH EVERY IN THE EVENING     Not Delegated - Neurology:  Anticonvulsants Failed - 08/25/2018 12:29 PM      Failed - This refill cannot be delegated      Failed - HCT in normal range and within 360 days    HCT  Date Value Ref Range Status  01/20/2017 39.7 36.0 - 46.0 % Final   HCT, POC  Date Value Ref Range Status  07/16/2018 44.2 (A) 29 - 41 % Final   Hematocrit  Date Value Ref Range Status  10/02/2016 40.4 34.0 - 46.6 % Final         Failed - HGB in normal range and within 360 days    Hemoglobin  Date Value Ref Range Status  07/16/2018 15.3 (A) 11 - 14.6 g/dL Final  01/20/2017 13.8 12.0 - 15.0 g/dL Final  10/02/2016 13.7 11.1 - 15.9 g/dL Final         Passed - PLT in normal range and within 360 days    Platelets  Date Value Ref Range Status  01/20/2017 239 150 - 400 K/uL Final  10/02/2016 251 150 - 379 x10E3/uL Final   Platelet Count, POC  Date Value Ref Range Status  07/16/2018 310 142 - 424 K/uL Final         Passed - WBC in normal range and within 360 days    WBC  Date Value Ref Range Status  07/16/2018 10.2 4.6 - 10.2 K/uL Final  01/20/2017 6.0 4.0 - 10.5 K/uL Final         Passed - Valid encounter within last 12 months    Recent Outpatient Visits          1 month ago Mild persistent asthma with acute exacerbation   [facility], Lilia Argue, MD   1 month ago Mild persistent asthma with acute exacerbation   [facility], Lilia Argue, MD   1 month ago Intractable migraine with aura  with status migrainosus   Primary Care at [facility], Fenton Malling, MD   2 months ago Mild persistent asthma, unspecified whether complicated   [facility] at [facility], Laurey Arrow, MD   10 months ago Lymphadenopathy   Primary Care at [facility], [place], [place]

## 2018-09-21 ENCOUNTER — Encounter: Payer: Self-pay | Admitting: Registered Nurse

## 2018-10-06 ENCOUNTER — Other Ambulatory Visit: Payer: Self-pay | Admitting: Emergency Medicine

## 2018-10-06 DIAGNOSIS — Z30019 Encounter for initial prescription of contraceptives, unspecified: Secondary | ICD-10-CM

## 2018-10-08 IMAGING — CT CT HEAD W/O CM
3 series · 16 of 47 positions shown, 19 images · non-contrast
Comparison: 03/29/2016.

CLINICAL DATA: Hypotension, hallucinations, history of traumatic
brain injury in seizures.

EXAM:
CT HEAD WITHOUT CONTRAST
TECHNIQUE: Contiguous axial images were obtained from the base of the skull
through the vertex without intravenous contrast.

[Series 2: head wo · axial · 0.43mm/px · z∈[+1130,+1270]mm · 10 of 34 slices shown, 13 images]
[im 3/34  brain]
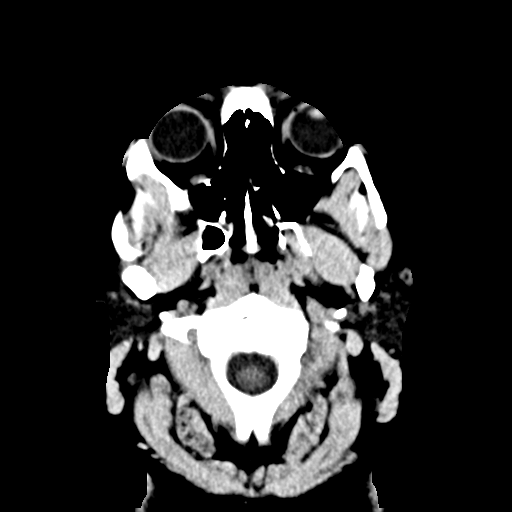
[im 3/34  bone]
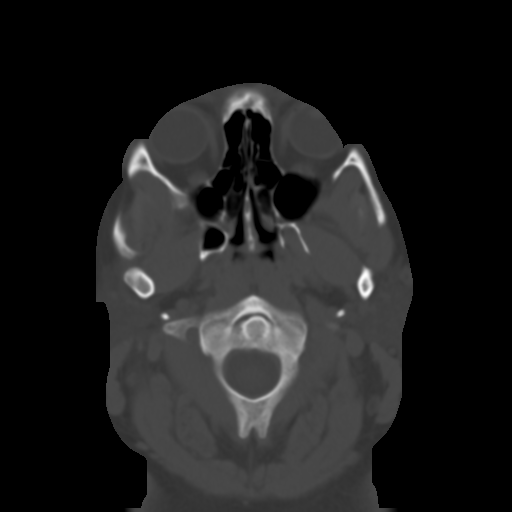
[im 6/34  brain]
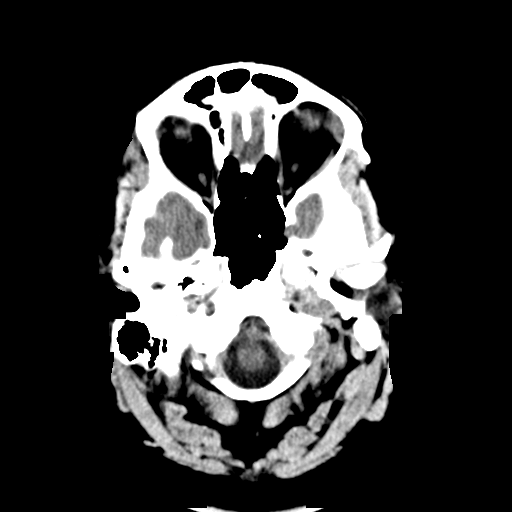
[im 10/34  brain]
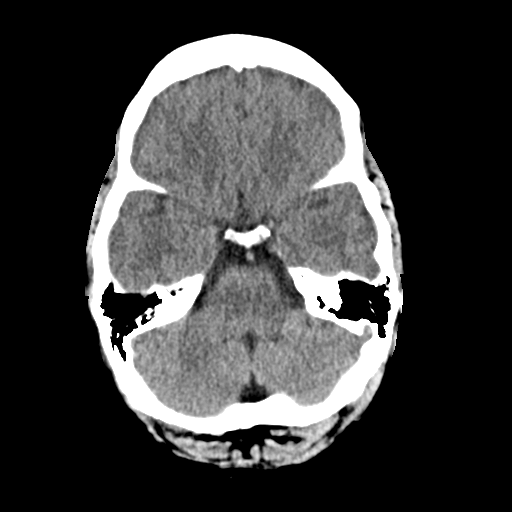
[im 12/34  brain]
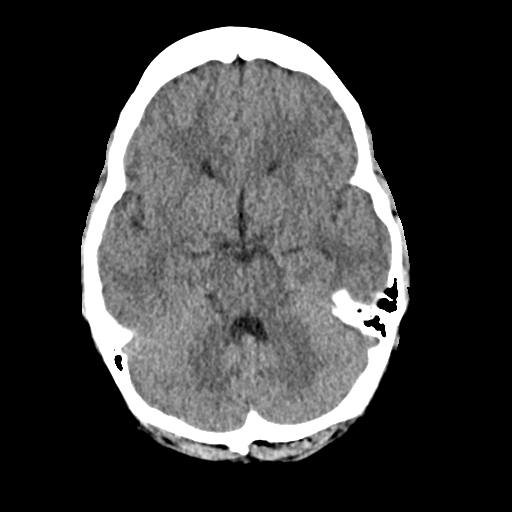
[im 15/34  brain]
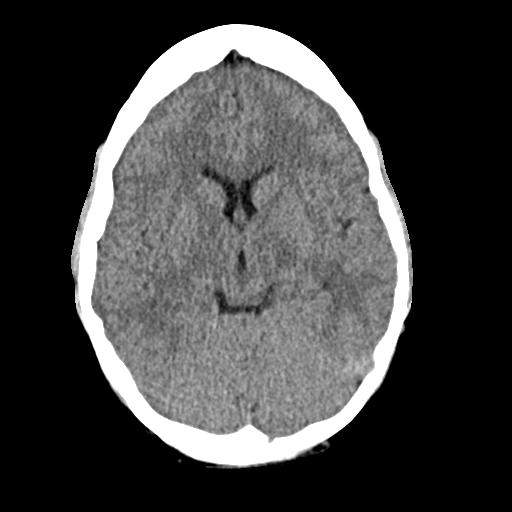
[im 15/34  bone]
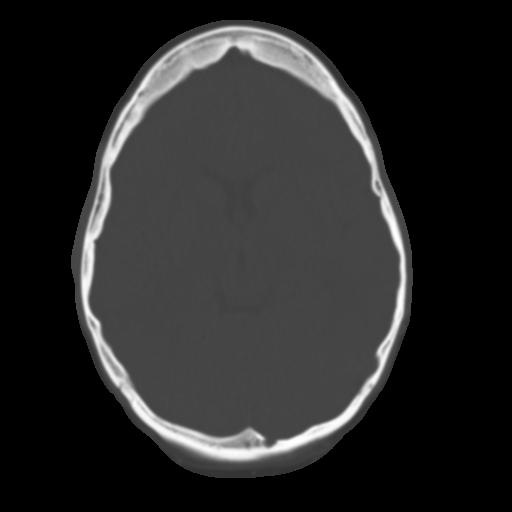
[im 19/34  brain]
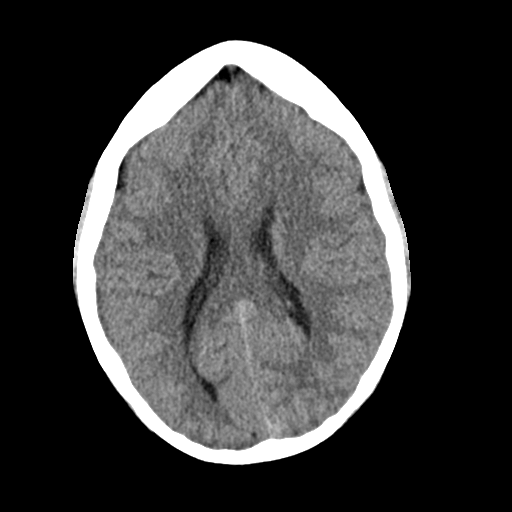
[im 22/34  brain]
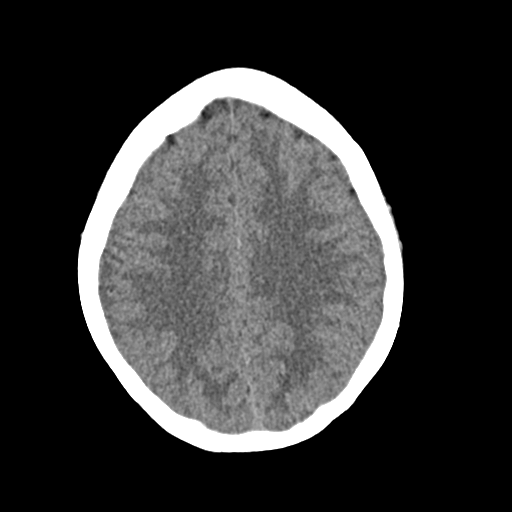
[im 26/34  brain]
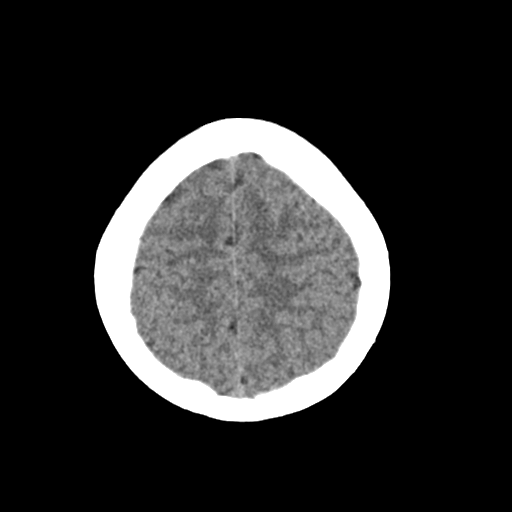
[im 28/34  brain]
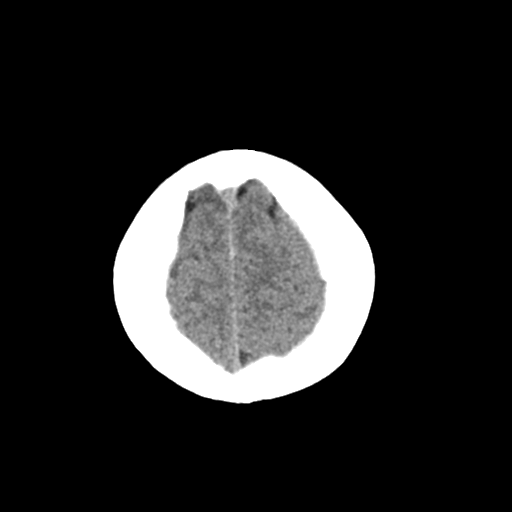
[im 28/34  bone]
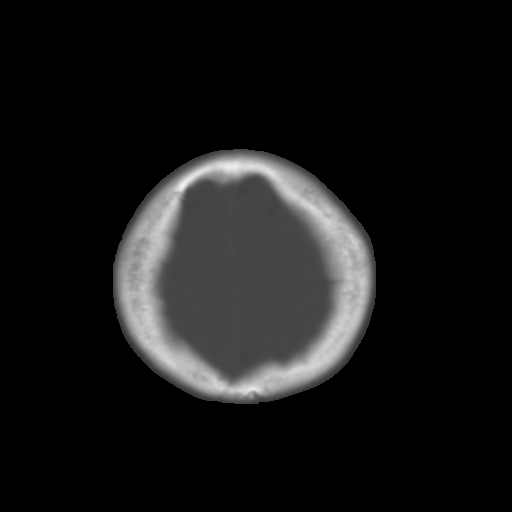
[im 31/34  brain]
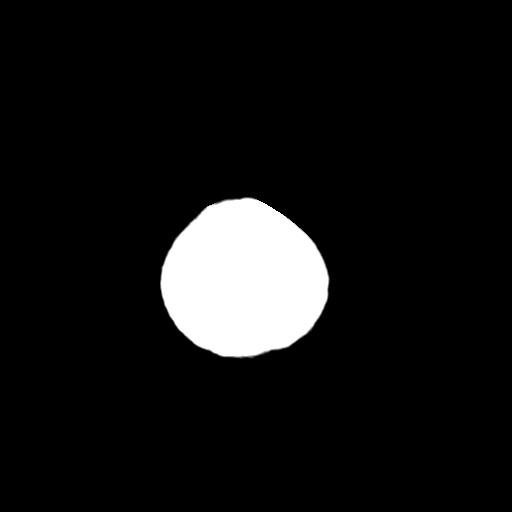

[Series 4: coronal soft · coronal · 0.32mm/px · 3 of 68 slices shown]
[im 23/68  brain]
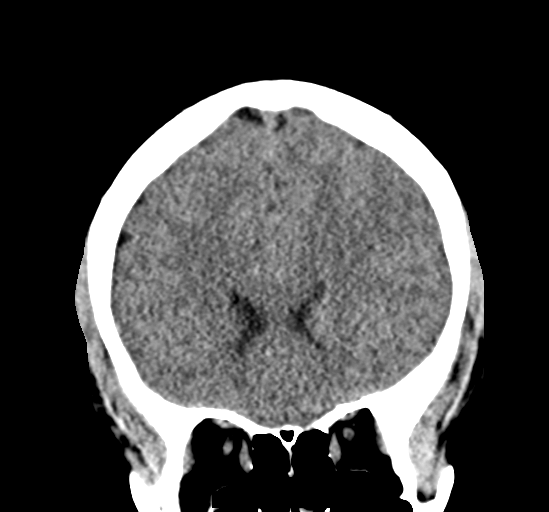
[im 30/68  brain]
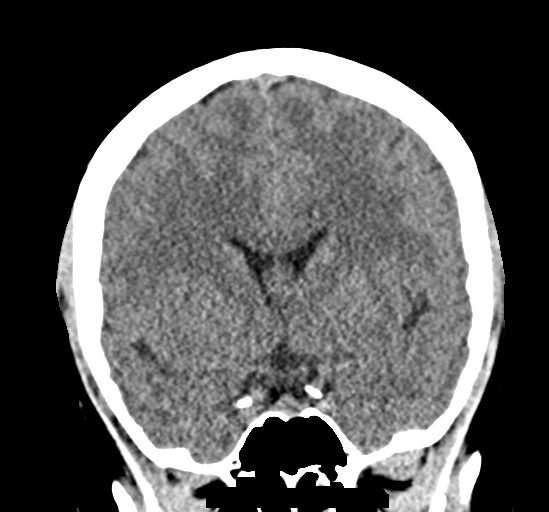
[im 38/68  brain]
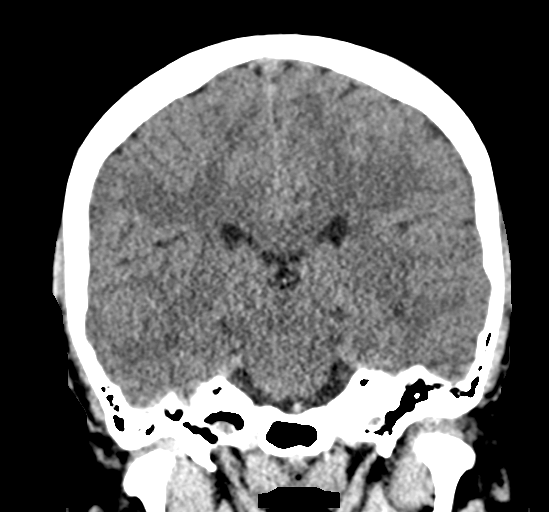

[Series 5: sag soft · sagittal · 0.35mm/px · 3 of 51 slices shown]
[im 17/51  brain]
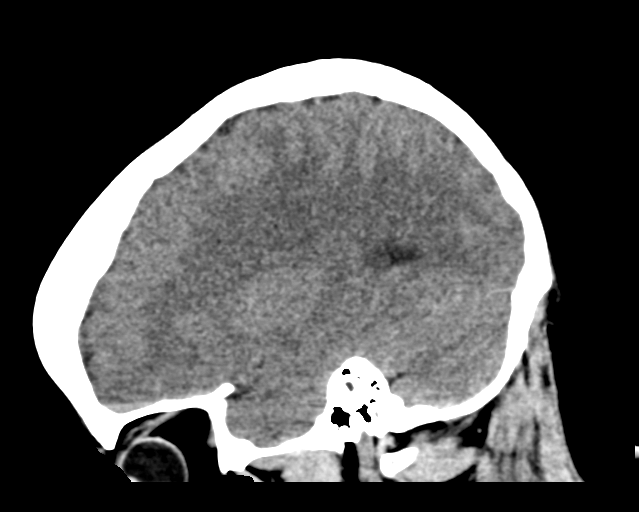
[im 26/51  brain]
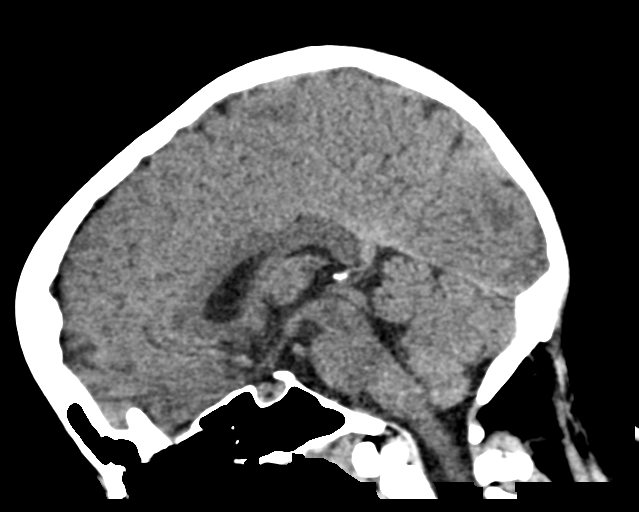
[im 34/51  brain]
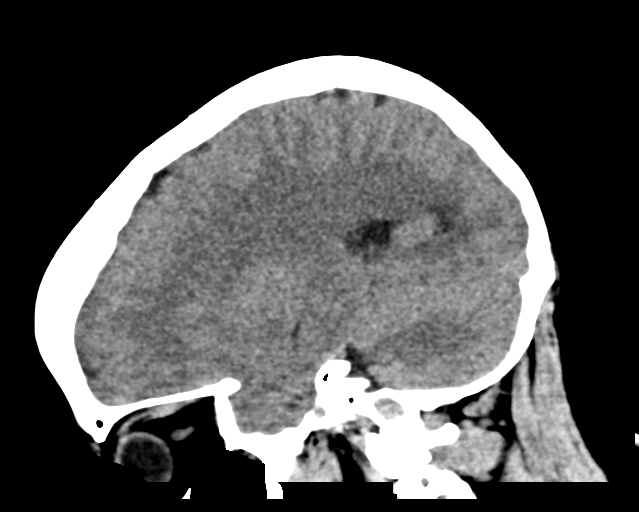

[16 of 47 positions shown; findings below may reference images not displayed]

FINDINGS: Brain: No evidence of an acute infarct, acute hemorrhage, mass
lesion, mass effect or hydrocephalus.

Vascular: No hyperdense vessel or unexpected calcification.

Skull: Normal. Negative for fracture or focal lesion.

Sinuses/Orbits: No acute finding.

Other: None.
IMPRESSION: Negative.

## 2018-11-10 ENCOUNTER — Telehealth: Payer: Self-pay | Admitting: Cardiovascular Disease

## 2018-11-10 NOTE — Telephone Encounter (Signed)
LVM to schedule

## 2018-11-17 ENCOUNTER — Telehealth: Payer: Self-pay | Admitting: Internal Medicine

## 2018-11-17 NOTE — Telephone Encounter (Signed)
New message   [facility] for pt to call back and schedule virtual visit with Dr. Lovena Le.

## 2019-08-17 ENCOUNTER — Telehealth: Payer: Self-pay | Admitting: *Deleted

## 2019-08-17 NOTE — Telephone Encounter (Signed)
A message was left, re: her follow up visit. 

## 2020-04-02 IMAGING — DX DG CHEST 2V
2 series · 2 of 2 positions shown · non-contrast
Comparison: None.

CLINICAL DATA: Worsening productive cough

EXAM:
CHEST - 2 VIEW

[chest pa]
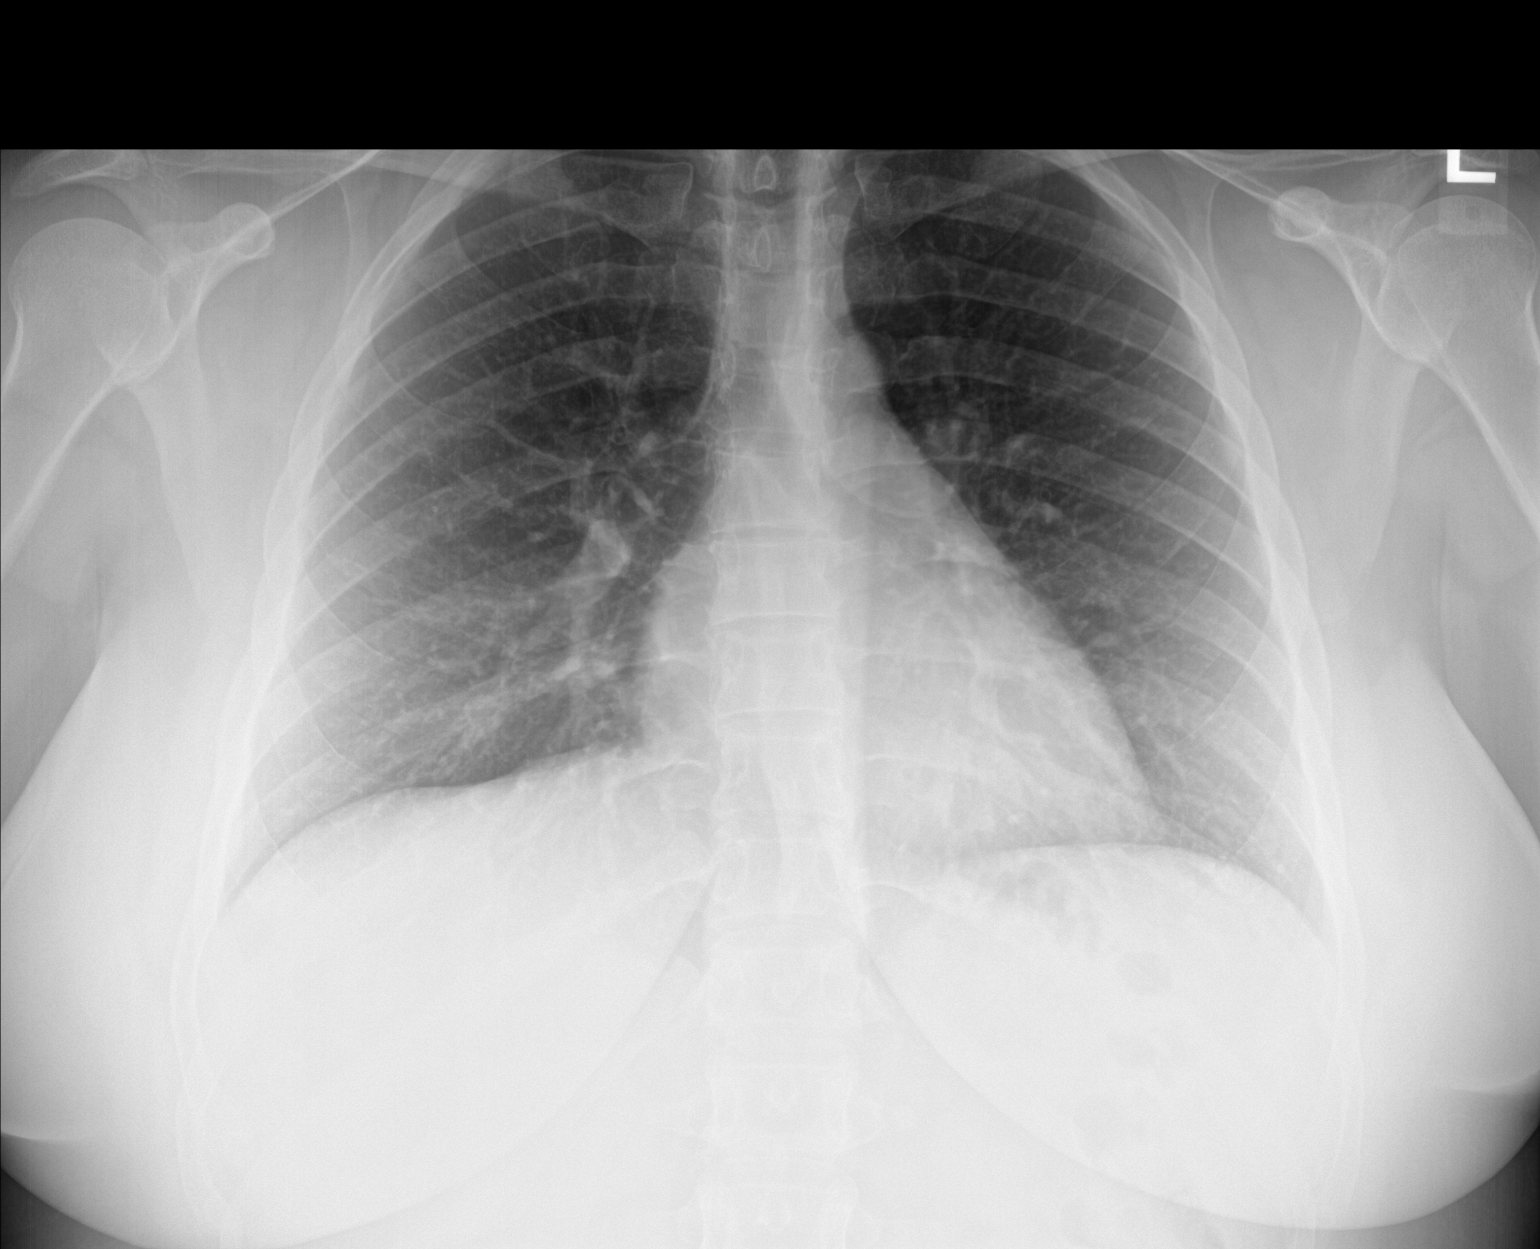

[chest lat]
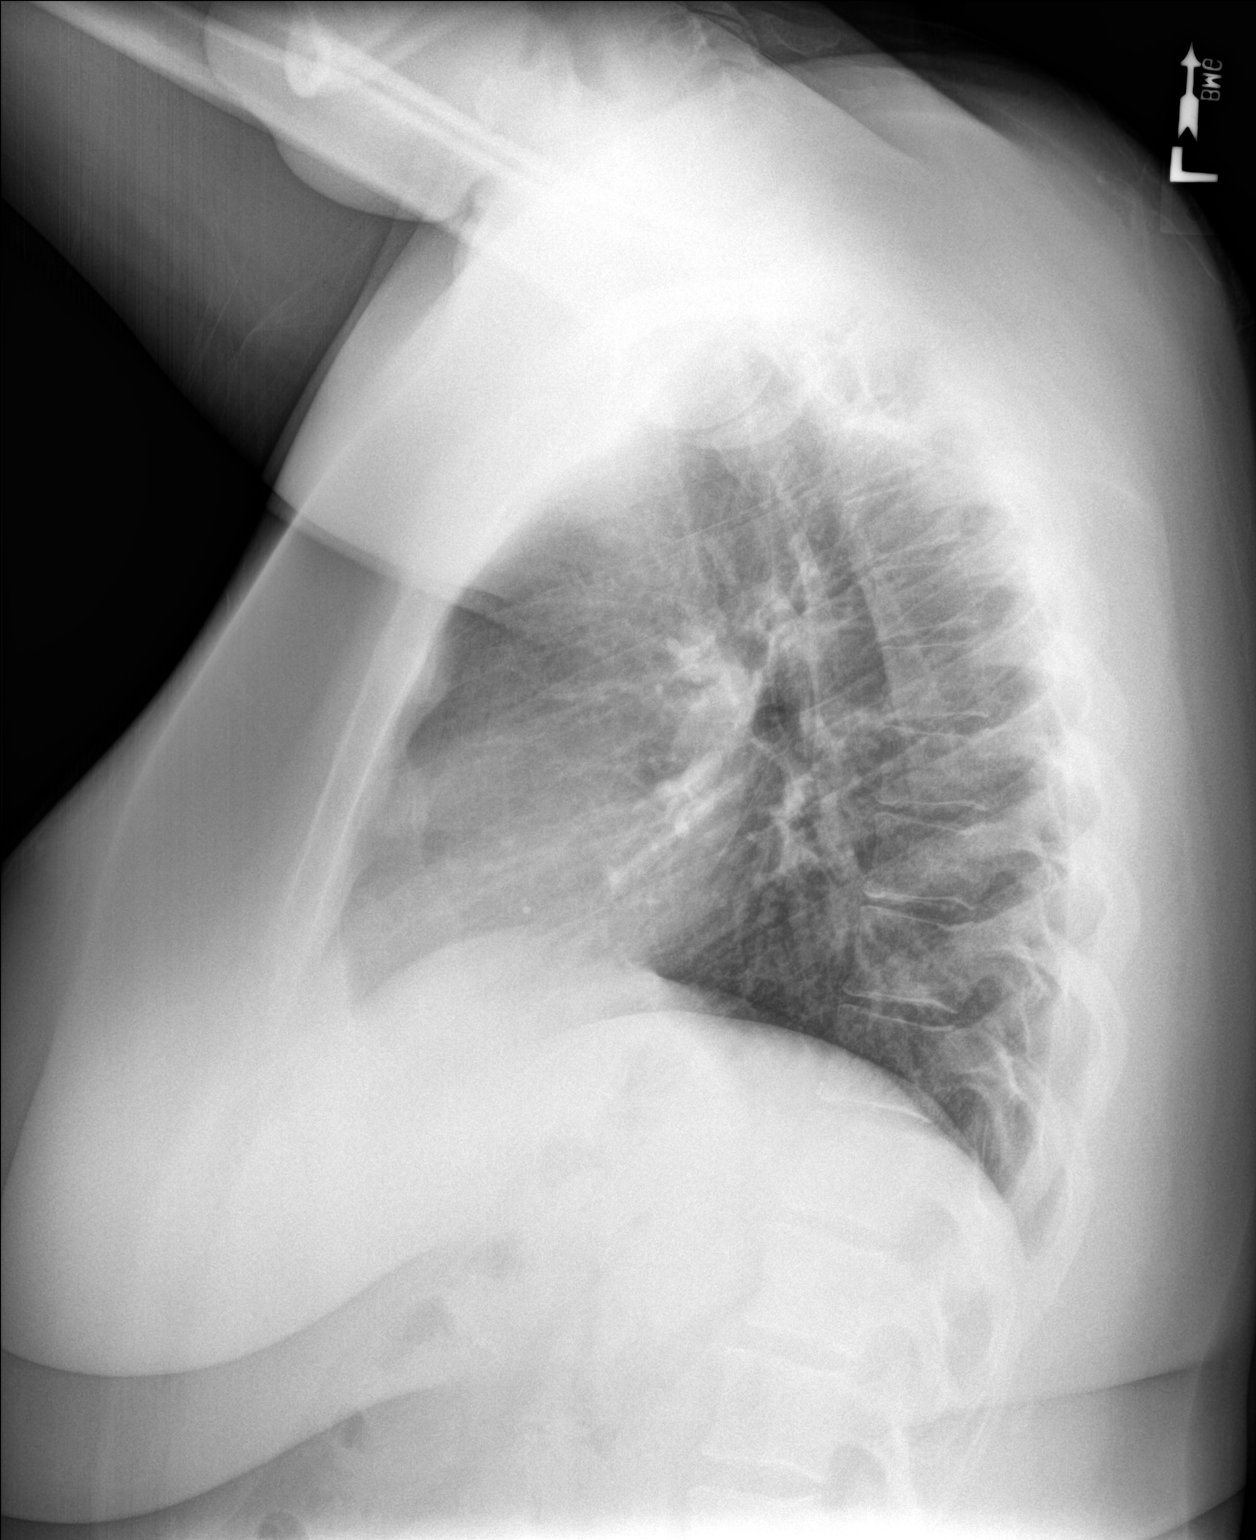

[2 of 2 positions shown; findings below may reference images not displayed]

FINDINGS: The heart size and mediastinal contours are within normal limits.
Both lungs are clear. The visualized skeletal structures are
unremarkable.
IMPRESSION: No active cardiopulmonary disease.
